# Patient Record
Sex: Male | Born: 1950 | Race: White | Hispanic: No | Marital: Single | Smoking: Current every day smoker
Health system: Southern US, Community
[De-identification: ages and names within clinical notes are randomized; demographics above are authoritative.]

## PROBLEM LIST (undated history)

## (undated) DIAGNOSIS — J449 Chronic obstructive pulmonary disease, unspecified: Secondary | ICD-10-CM

## (undated) DIAGNOSIS — Z8719 Personal history of other diseases of the digestive system: Secondary | ICD-10-CM

## (undated) DIAGNOSIS — C3490 Malignant neoplasm of unspecified part of unspecified bronchus or lung: Secondary | ICD-10-CM

## (undated) DIAGNOSIS — M1990 Unspecified osteoarthritis, unspecified site: Secondary | ICD-10-CM

## (undated) DIAGNOSIS — I10 Essential (primary) hypertension: Secondary | ICD-10-CM

## (undated) DIAGNOSIS — K219 Gastro-esophageal reflux disease without esophagitis: Secondary | ICD-10-CM

## (undated) DIAGNOSIS — I2510 Atherosclerotic heart disease of native coronary artery without angina pectoris: Secondary | ICD-10-CM

## (undated) DIAGNOSIS — E119 Type 2 diabetes mellitus without complications: Secondary | ICD-10-CM

## (undated) DIAGNOSIS — I700 Atherosclerosis of aorta: Secondary | ICD-10-CM

## (undated) HISTORY — PX: CARDIAC CATHETERIZATION: SHX172

## (undated) HISTORY — PX: LUNG BIOPSY: SHX232

## (undated) HISTORY — DX: Atherosclerosis of aorta: I70.0

## (undated) HISTORY — DX: Gastro-esophageal reflux disease without esophagitis: K21.9

## (undated) HISTORY — DX: Malignant neoplasm of unspecified part of unspecified bronchus or lung: C34.90

## (undated) HISTORY — DX: Atherosclerotic heart disease of native coronary artery without angina pectoris: I25.10

## (undated) HISTORY — PX: ELBOW SURGERY: SHX618

## (undated) MED FILL — Durvalumab Soln for IV Infusion 500 MG/10ML (50 MG/ML): INTRAVENOUS | Qty: 30 | Status: AC

## (undated) MED FILL — Carboplatin IV Soln 450 MG/45ML: INTRAVENOUS | Qty: 23 | Status: AC

---
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
## 2017-11-17 HISTORY — PX: PROSTATE SURGERY: SHX751

## 2019-11-18 HISTORY — PX: COLONOSCOPY: SHX174

## 2020-10-19 ENCOUNTER — Other Ambulatory Visit: Payer: Self-pay | Admitting: *Deleted

## 2020-10-19 ENCOUNTER — Ambulatory Visit (INDEPENDENT_AMBULATORY_CARE_PROVIDER_SITE_OTHER): Payer: Medicare Other | Admitting: Sports Medicine

## 2020-10-19 ENCOUNTER — Encounter: Payer: Self-pay | Admitting: Sports Medicine

## 2020-10-19 ENCOUNTER — Other Ambulatory Visit: Payer: Self-pay

## 2020-10-19 DIAGNOSIS — B351 Tinea unguium: Secondary | ICD-10-CM | POA: Diagnosis not present

## 2020-10-19 DIAGNOSIS — E1142 Type 2 diabetes mellitus with diabetic polyneuropathy: Secondary | ICD-10-CM | POA: Diagnosis not present

## 2020-10-19 DIAGNOSIS — M79675 Pain in left toe(s): Secondary | ICD-10-CM | POA: Diagnosis not present

## 2020-10-19 DIAGNOSIS — I890 Lymphedema, not elsewhere classified: Secondary | ICD-10-CM

## 2020-10-19 DIAGNOSIS — M79674 Pain in right toe(s): Secondary | ICD-10-CM

## 2020-10-19 NOTE — Progress Notes (Signed)
Subjective: Allen [name] is a 69 y.o. male patient with history of diabetes who presents to office today complaining of long,mildly painful nails  while ambulating in shoes; unable to trim. Patient states that his blood sugar was not recorded and does not routinely monitor states that he saw his PCP on last month and will go again on next week states that his legs are swollen with some numbness and tingling and some soreness at his long toenails.  Patient admits to a history of lower extremity swelling and COPD as well.    There are no problems to display for this patient.  No current outpatient medications on file prior to visit.   No current facility-administered medications on file prior to visit.   No Known Allergies  No results found for this or any previous visit (from the past 2160 hour(s)).  Objective: General: Patient is awake, alert, and oriented x 3 and in no acute distress.  Integument: Skin is warm, dry and supple bilateral. Nails are tender, long, thickened and dystrophic with subungual debris, consistent with onychomycosis, 1-5 bilateral. No signs of infection. No open lesions or preulcerative lesions present bilateral. Remaining integument unremarkable.  Vasculature:  Dorsalis Pedis pulse 0/4 bilateral. Posterior Tibial pulse  0/4 bilateral. Capillary fill time <5 sec 1-5 bilateral. No hair growth to the level of the digits. Temperature gradient within normal limits. No varicosities present bilateral. 2+ pitting and non pitting edema present bilateral.   Neurology: The patient has diminished sensation measured with a 5.07/10g Semmes Weinstein Monofilament at all pedal sites bilateral . Vibratory sensation absent bilateral with tuning fork. No Babinski sign present bilateral.   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 Muscular strength 5/5 in all lower extremity muscular groups bilateral without pain on range of motion . No tenderness with calf  compression bilateral.  Assessment and Plan: Problem List Items Addressed This Visit    None    Visit Diagnoses    Pain due to onychomycosis of toenails of both feet    -  Primary   Lymphedema       Diabetic polyneuropathy associated with type 2 diabetes mellitus ([place])          -Examined patient. -Discussed and educated patient on diabetic foot care, especially with regards to the vascular, neurological and musculoskeletal systems.  -Stressed the importance of good glycemic control and the detriment of not controlling glucose levels in relation to the foot. -Mechanically debrided all nails 1-5 bilateral using sterile nail nipper and filed with dremel without incident  -Advised patient that he needs to discuss with PCP fluid meds and meds for neuropathy if continues to be bothersome -Answered all patient questions -Patient to return  in 3 months for at risk foot care -Patient advised to call the office if any problems or questions arise in the meantime.  Landis Martins, DPM

## 2020-12-03 ENCOUNTER — Inpatient Hospital Stay (HOSPITAL_COMMUNITY)
Admission: EM | Admit: 2020-12-03 | Discharge: 2020-12-06 | DRG: 177 | Disposition: A | Discharge status: Home / Self Care | Payer: 59 | Attending: Family Medicine | Admitting: Family Medicine

## 2020-12-03 ENCOUNTER — Emergency Department (HOSPITAL_COMMUNITY): Payer: 59

## 2020-12-03 ENCOUNTER — Other Ambulatory Visit: Payer: Self-pay

## 2020-12-03 ENCOUNTER — Encounter (HOSPITAL_COMMUNITY): Payer: Self-pay | Admitting: *Deleted

## 2020-12-03 DIAGNOSIS — K219 Gastro-esophageal reflux disease without esophagitis: Secondary | ICD-10-CM | POA: Diagnosis present

## 2020-12-03 DIAGNOSIS — E1165 Type 2 diabetes mellitus with hyperglycemia: Secondary | ICD-10-CM | POA: Diagnosis present

## 2020-12-03 DIAGNOSIS — J439 Emphysema, unspecified: Secondary | ICD-10-CM | POA: Diagnosis present

## 2020-12-03 DIAGNOSIS — I10 Essential (primary) hypertension: Secondary | ICD-10-CM | POA: Diagnosis present

## 2020-12-03 DIAGNOSIS — N179 Acute kidney failure, unspecified: Secondary | ICD-10-CM | POA: Diagnosis present

## 2020-12-03 DIAGNOSIS — R0902 Hypoxemia: Secondary | ICD-10-CM | POA: Diagnosis present

## 2020-12-03 DIAGNOSIS — Z79899 Other long term (current) drug therapy: Secondary | ICD-10-CM | POA: Diagnosis not present

## 2020-12-03 DIAGNOSIS — U071 COVID-19: Principal | ICD-10-CM

## 2020-12-03 DIAGNOSIS — Z7984 Long term (current) use of oral hypoglycemic drugs: Secondary | ICD-10-CM

## 2020-12-03 DIAGNOSIS — F17210 Nicotine dependence, cigarettes, uncomplicated: Secondary | ICD-10-CM | POA: Diagnosis present

## 2020-12-03 DIAGNOSIS — J9601 Acute respiratory failure with hypoxia: Secondary | ICD-10-CM | POA: Diagnosis present

## 2020-12-03 HISTORY — DX: Chronic obstructive pulmonary disease, unspecified: J44.9

## 2020-12-03 HISTORY — DX: Essential (primary) hypertension: I10

## 2020-12-03 HISTORY — DX: Type 2 diabetes mellitus without complications: E11.9

## 2020-12-03 LAB — HIV ANTIBODY (ROUTINE TESTING W REFLEX): HIV Screen 4th Generation wRfx: NONREACTIVE

## 2020-12-03 LAB — CBC WITH DIFFERENTIAL/PLATELET
Abs Immature Granulocytes: 0.04 10*3/uL (ref 0.00–0.07)
Basophils Absolute: 0 10*3/uL (ref 0.0–0.1)
Basophils Relative: 0 %
Eosinophils Absolute: 0 10*3/uL (ref 0.0–0.5)
Eosinophils Relative: 0 %
HCT: 48.4 % (ref 39.0–52.0)
Hemoglobin: 15 g/dL (ref 13.0–17.0)
Immature Granulocytes: 0 %
Lymphocytes Relative: 5 %
Lymphs Abs: 0.4 10*3/uL — ABNORMAL LOW (ref 0.7–4.0)
MCH: 28.1 pg (ref 26.0–34.0)
MCHC: 31 g/dL (ref 30.0–36.0)
MCV: 90.6 fL (ref 80.0–100.0)
Monocytes Absolute: 0.7 10*3/uL (ref 0.1–1.0)
Monocytes Relative: 8 %
Neutro Abs: 7.8 10*3/uL — ABNORMAL HIGH (ref 1.7–7.7)
Neutrophils Relative %: 87 %
Platelets: 233 10*3/uL (ref 150–400)
RBC: 5.34 MIL/uL (ref 4.22–5.81)
RDW: 14.4 % (ref 11.5–15.5)
WBC: 9 10*3/uL (ref 4.0–10.5)
nRBC: 0 % (ref 0.0–0.2)

## 2020-12-03 LAB — COMPREHENSIVE METABOLIC PANEL
ALT: 29 U/L (ref 0–44)
AST: 30 U/L (ref 15–41)
Albumin: 3.6 g/dL (ref 3.5–5.0)
Alkaline Phosphatase: 55 U/L (ref 38–126)
Anion gap: 13 (ref 5–15)
BUN: 25 mg/dL — ABNORMAL HIGH (ref 8–23)
CO2: 25 mmol/L (ref 22–32)
Calcium: 8.9 mg/dL (ref 8.9–10.3)
Chloride: 95 mmol/L — ABNORMAL LOW (ref 98–111)
Creatinine, Ser: 1.55 mg/dL — ABNORMAL HIGH (ref 0.61–1.24)
GFR, Estimated: 48 mL/min — ABNORMAL LOW (ref >60)
Glucose, Bld: 131 mg/dL — ABNORMAL HIGH (ref 70–99)
Potassium: 4.7 mmol/L (ref 3.5–5.1)
Sodium: 133 mmol/L — ABNORMAL LOW (ref 135–145)
Total Bilirubin: 0.8 mg/dL (ref 0.3–1.2)
Total Protein: 7.3 g/dL (ref 6.5–8.1)

## 2020-12-03 LAB — URINALYSIS, ROUTINE W REFLEX MICROSCOPIC
Bacteria, UA: NONE SEEN
Bilirubin Urine: NEGATIVE
Glucose, UA: NEGATIVE mg/dL
Ketones, ur: 5 mg/dL — AB
Leukocytes,Ua: NEGATIVE
Nitrite: NEGATIVE
Protein, ur: 30 mg/dL — AB
Specific Gravity, Urine: 1.025 (ref 1.005–1.030)
pH: 5 (ref 5.0–8.0)

## 2020-12-03 LAB — PROCALCITONIN: Procalcitonin: <0.1 ng/mL

## 2020-12-03 LAB — RESP PANEL BY RT-PCR (FLU A&B, COVID) ARPGX2
Influenza A by PCR: NEGATIVE
Influenza B by PCR: NEGATIVE
SARS Coronavirus 2 by RT PCR: POSITIVE — AB

## 2020-12-03 LAB — FERRITIN: Ferritin: 143 ng/mL (ref 24–336)

## 2020-12-03 LAB — C-REACTIVE PROTEIN: CRP: 10.1 mg/dL — ABNORMAL HIGH (ref <1.0)

## 2020-12-03 LAB — LACTATE DEHYDROGENASE: LDH: 193 U/L — ABNORMAL HIGH (ref 98–192)

## 2020-12-03 LAB — FIBRINOGEN: Fibrinogen: 533 mg/dL — ABNORMAL HIGH (ref 210–475)

## 2020-12-03 LAB — TRIGLYCERIDES: Triglycerides: 110 mg/dL (ref <150)

## 2020-12-03 LAB — D-DIMER, QUANTITATIVE: D-Dimer, Quant: 1.02 ug/mL-FEU — ABNORMAL HIGH (ref 0.00–0.50)

## 2020-12-03 LAB — LACTIC ACID, PLASMA: Lactic Acid, Venous: 1.2 mmol/L (ref 0.5–1.9)

## 2020-12-03 MED ORDER — FLUTICASONE-UMECLIDIN-VILANT 100-62.5-25 MCG/INH IN AEPB: 1.0000 | INHALATION_SPRAY | Freq: Every day | RESPIRATORY_TRACT | Status: DC

## 2020-12-03 MED ORDER — SODIUM CHLORIDE 0.9 % IV SOLN
100.0000 mg | Freq: Every day | INTRAVENOUS | Status: DC
  Administered 2020-12-04 – 2020-12-06 (×3): 100 mg via INTRAVENOUS
  Filled 2020-12-03 (×4): qty 20

## 2020-12-03 MED ORDER — UMECLIDINIUM BROMIDE 62.5 MCG/INH IN AEPB
1.0000 | INHALATION_SPRAY | Freq: Every day | RESPIRATORY_TRACT | Status: DC
  Administered 2020-12-05 – 2020-12-06 (×2): 1 via RESPIRATORY_TRACT
  Filled 2020-12-03: qty 7

## 2020-12-03 MED ORDER — ALBUTEROL SULFATE HFA 108 (90 BASE) MCG/ACT IN AERS
1.0000 | INHALATION_SPRAY | Freq: Four times a day (QID) | RESPIRATORY_TRACT | Status: DC
  Administered 2020-12-03 – 2020-12-04 (×5): 2 via RESPIRATORY_TRACT
  Filled 2020-12-03 (×2): qty 6.7

## 2020-12-03 MED ORDER — ACETAMINOPHEN 325 MG PO TABS
650.0000 mg | ORAL_TABLET | Freq: Once | ORAL | Status: AC
  Administered 2020-12-03: 650 mg via ORAL
  Filled 2020-12-03: qty 2

## 2020-12-03 MED ORDER — SODIUM CHLORIDE 0.9 % IV SOLN
200.0000 mg | Freq: Once | INTRAVENOUS | Status: AC
  Administered 2020-12-03: 200 mg via INTRAVENOUS
  Filled 2020-12-03: qty 40

## 2020-12-03 MED ORDER — IOHEXOL 350 MG/ML SOLN
75.0000 mL | Freq: Once | INTRAVENOUS | Status: AC | PRN
  Administered 2020-12-03: 75 mL via INTRAVENOUS

## 2020-12-03 MED ORDER — FLUTICASONE FUROATE-VILANTEROL 200-25 MCG/INH IN AEPB
1.0000 | INHALATION_SPRAY | Freq: Every day | RESPIRATORY_TRACT | Status: DC
  Administered 2020-12-05 – 2020-12-06 (×2): 1 via RESPIRATORY_TRACT
  Filled 2020-12-03: qty 28

## 2020-12-03 MED ORDER — ENOXAPARIN SODIUM 40 MG/0.4ML ~~LOC~~ SOLN
40.0000 mg | SUBCUTANEOUS | Status: DC
  Administered 2020-12-03 – 2020-12-04 (×2): 40 mg via SUBCUTANEOUS
  Filled 2020-12-03 (×3): qty 0.4

## 2020-12-03 MED ORDER — ACETAMINOPHEN 650 MG RE SUPP: 650.0000 mg | Freq: Four times a day (QID) | RECTAL | Status: DC | PRN

## 2020-12-03 MED ORDER — DEXAMETHASONE 4 MG PO TABS
6.0000 mg | ORAL_TABLET | Freq: Every day | ORAL | Status: DC
  Administered 2020-12-04 – 2020-12-06 (×3): 6 mg via ORAL
  Filled 2020-12-03 (×3): qty 2

## 2020-12-03 MED ORDER — DEXAMETHASONE SODIUM PHOSPHATE 10 MG/ML IJ SOLN
6.0000 mg | Freq: Once | INTRAMUSCULAR | Status: AC
  Administered 2020-12-03: 6 mg via INTRAVENOUS
  Filled 2020-12-03: qty 1

## 2020-12-03 MED ORDER — INSULIN ASPART 100 UNIT/ML ~~LOC~~ SOLN
0.0000 [IU] | Freq: Three times a day (TID) | SUBCUTANEOUS | Status: DC
  Administered 2020-12-04: 1 [IU] via SUBCUTANEOUS
  Administered 2020-12-04: 2 [IU] via SUBCUTANEOUS
  Administered 2020-12-05: 1 [IU] via SUBCUTANEOUS
  Administered 2020-12-05 (×2): 3 [IU] via SUBCUTANEOUS
  Administered 2020-12-06: 1 [IU] via SUBCUTANEOUS

## 2020-12-03 MED ORDER — PANTOPRAZOLE SODIUM 40 MG PO TBEC: 40.0000 mg | DELAYED_RELEASE_TABLET | Freq: Every day | ORAL | Status: DC | PRN

## 2020-12-03 MED ORDER — SODIUM CHLORIDE 0.9 % IV BOLUS
500.0000 mL | Freq: Once | INTRAVENOUS | Status: AC
  Administered 2020-12-03: 500 mL via INTRAVENOUS

## 2020-12-03 MED ORDER — ALBUTEROL SULFATE HFA 108 (90 BASE) MCG/ACT IN AERS: 2.0000 | INHALATION_SPRAY | RESPIRATORY_TRACT | Status: DC | PRN

## 2020-12-03 MED ORDER — ACETAMINOPHEN 325 MG PO TABS
650.0000 mg | ORAL_TABLET | Freq: Four times a day (QID) | ORAL | Status: DC | PRN
  Administered 2020-12-05: 650 mg via ORAL
  Filled 2020-12-03: qty 2

## 2020-12-03 NOTE — Progress Notes (Signed)
Pt brought to CT @ 1250pm.  Pt refused CT and was taken back to ER

## 2020-12-03 NOTE — ED Triage Notes (Signed)
Pt arrived by [facility] from home. Test + for covid on 1/15 and having increase in sob and chest discomfort. Spo2 was 87% on room air per ems. Increased to 97% on 4L [employer].

## 2020-12-03 NOTE — ED Notes (Signed)
Hospital Bed Ordered @ 1752-per Lorrin Goodell, RN called by Levada Dy

## 2020-12-03 NOTE — ED Notes (Signed)
Notified Dr Almyra Free of pt's increased oxygen needs.

## 2020-12-03 NOTE — H&P (Cosign Needed Addendum)
[facility] Admission History and Physical Service Pager: [phone]  Patient name: Allen [name] Medical record number: [id number] Date of birth: Mar 24, 1951 Age: 70 y.o. Gender: male  Primary Care Provider: Darrol Jump, NP Consultants: None Code Status: Partial (DNI) Preferred Emergency Contact: Brother- Brunetta Jeans [phone]  Chief Complaint: Shortness of breath  Assessment and Plan: Allen [name] is a 70 y.o. male presenting with worsening shortness of breath and hypoxia in the setting of known COVID-19. PMH is significant for T2DM, HTN, GERD, tobacco use and COPD.  Acute Hypoxemic Respiratory Failure  COVID-19+ H/o emphysema Patient presents with worsening dyspnea x1 day after testing positive for COVID on 1/15 at [facility]. In the ED he was febrile to 101F, tachycardic to the 130s, and tachypneic with SpO2 87% on room air. On my exam patient breathing comfortably on 4.5L HFNC, but is persistently tachycardic in the 110s.  No oxygen requirement at baseline.  Labs remarkable for COVID+, Cr 1.55 (baseline unknown), CRP 10.1, D-Dimer 1.02, and other inflammatory markers minimally elevated. CXR was unremarkable. CTA showed patchy opacities in b/l lower lobes consistent with COVID as well as multiple nodules in the L lung, but no evidence of PE. Patient is unvaccinated.  He is a current 1 PPD smoker.  In the ED, patient received dexamethasone, remdesivir, 500 mL bolus NS.  Patient has home albuterol which he used frequently over the past 2 days and states that it did help his breathing improve intermittently. -Admit to FPTS, med-surg, attending Dr. Thompson Grayer -OOB with assistance only -s/p Dexamethasone 6mg  and Remdesevir 200mg  in the ED -Continue Remdesevir for 5 day total course -Continue Decadron 6mg  daily x10 days -Trend CBC, CRP, D-dimer -Supplemental O2 as needed, goal SpO2 88-92% -PT/OT eval and treat -Prone positioning as tolerated -Incentive  spirometer every hour while awake -Tylenol as needed -Continue home breathing treatments  -Trelegy, albuterol - covid vaccine in 21 days  ?AKI Cr 1.55 on admission. Patient's baseline is unknown. UA shows 5 ketones and 30 protein which may be indicative of some dehydration/AKI. -Daily BMP -Encourage PO intake -Holding home Lisinopril, HCTZ, and Lasix  T2DM Glucose 131 on admission. No prior records in chart or care everywhere, therefore A1c unknown. Home meds include Metformin 500mg  daily.  Likely to have hyperglycemia in setting of steroid use. -Will obtain A1c -sSSI -CBG monitoring AC &QHS -Holding home Metformin, consider restarting with improved kidney function  HTN Patient has been normotensive since arrival. Home meds include Lisinopril 20mg  daily, HCTZ 25mg  daily, and Lasix 20mg  daily (Lasix is for lower extremity edema) -Holding home meds due to AKI -Monitor BP  COPD  Tobacco Use On Trelegy and albuterol at home. Smokes 1 PPD for many years. -Continue home inhalers -Goal SpO2 88-92% -Patient declines nicotine patch   Lung Nodules on CTA CTA showed multiple nodular-like densities in the L lung. Could potentially be concerning for malignancy due to patient's long smoking history. No prior imaging to compare to. -Recommend follow-up CT in 3-6 months  GERD Chronic, stable. On omeprazole 40mg  daily prn at home. -Protonix 40mg  daily prn   FEN/GI: Heart healthy diet Prophylaxis: Lovenox  Disposition: med-surg  History of Present Illness:  Allen [name] is a 70 y.o. male presenting with shortness of breath x1 day. Patient tested positive for COVID on 1/15 at [facility]. He developed SOB yesterday evening and states he struggled through the night and then called EMS this morning. He reports associated chills. Endorses some cough and congestion but states  these are unchanged from baseline. No chest pain, abdominal pain, N/V/D or constipation. He is not vaccinated  against COVID-19. Patient also reports bilateral ankle swelling x1 month, which is currently being treated with Lasix.    Review Of Systems: Per HPI with the following additions:   Review of Systems  Constitutional: Positive for chills and fever.  HENT: Positive for congestion.   Respiratory: Positive for cough and shortness of breath.   Cardiovascular: Positive for leg swelling. Negative for chest pain.  Gastrointestinal: Negative for abdominal pain, constipation, diarrhea, nausea and vomiting.  Skin: Negative for rash.  Neurological: Negative for dizziness, syncope and headaches.     Patient Active Problem List   Diagnosis Date Noted  . COVID-19 12/03/2020   Past Medical History: Past Medical History:  Diagnosis Date  . COPD (chronic obstructive pulmonary disease) ([place])   . Diabetes mellitus without complication ([place])   . Hypertension    Past Surgical History: History reviewed. No pertinent surgical history.  Social History: Social History   Tobacco Use  . Smoking status: Current Every Day Smoker   Additional social history: Smokes 1ppd, lives at [place] with 2 cats. His neighbor keeps close eye on him.   Family History: No relevant past medical history  Allergies and Medications: No Known Allergies No current facility-administered medications on file prior to encounter.   Current Outpatient Medications on File Prior to Encounter  Medication Sig Dispense Refill  . albuterol (VENTOLIN HFA) 108 (90 Base) MCG/ACT inhaler Inhale 2 puffs into the lungs every 4 (four) hours as needed for wheezing or shortness of breath.    . Fluticasone-Umeclidin-Vilant (TRELEGY ELLIPTA) 100-62.5-25 MCG/INH AEPB Inhale 1 puff into the lungs daily.    . furosemide (LASIX) 20 MG tablet Take 20 mg by mouth daily as needed (leg swelling).    . hydrochlorothiazide (HYDRODIURIL) 25 MG tablet Take 25 mg by mouth daily.    Marland Kitchen lisinopril (ZESTRIL) 20 MG tablet Take 20 mg by mouth daily.    .  metFORMIN (GLUCOPHAGE) 500 MG tablet Take 500 mg by mouth daily.    Marland Kitchen omeprazole (PRILOSEC) 40 MG capsule Take 40 mg by mouth daily as needed (acid reflux/heartburn).    Marland Kitchen oxyCODONE-acetaminophen (PERCOCET) 10-325 MG tablet Take 1 tablet by mouth 3 (three) times daily as needed for pain.    Marland Kitchen ACCU-CHEK GUIDE test strip USE TO CHECK BLOOD SUGAR TWICE DAILY BEFORE MEALS    . dexamethasone (DECADRON) 6 MG tablet Take 6 mg by mouth.      Objective: BP 129/72   Pulse (!) 114   Temp (!) 97.5 F (36.4 C) (Oral)   Resp 20   SpO2 93%  Exam: General: alert, elderly gentleman, no acute distress Eyes: PERRL, normal sclera and conjunctiva ENTM: dry mucous membranes Neck: no cervical lymphadenopathy Cardiovascular: tachycardia, normal S1/S2 without m/r/g Respiratory: normal WOB on 4L [place]. Mild diffuse end-expiratory wheezes. No significant crackles Gastrointestinal: soft, nontender, nondistended Ext: 2+ pitting edema bilaterally up to knees. Chronic venous stasis changes Derm: diffuse epidermal inclusion cysts on back Neuro: oriented x3, grossly intact Psych: appropriate affect, normal speech  Labs and Imaging: CBC BMET  Recent Labs  Lab 12/03/20 1020  WBC 9.0  HGB 15.0  HCT 48.4  PLT 233   Recent Labs  Lab 12/03/20 1020  NA 133*  K 4.7  CL 95*  CO2 25  BUN 25*  CREATININE 1.55*  GLUCOSE 131*  CALCIUM 8.9    COVID positive CRP 10.1, LDH 193, ferritin  143, D-dimer 1.02, fibrinogen 533, procal <0.10, lactic acid 1.2  UA: small hgb, 5 ketones, 30 protein  EKG: Sinus tachycardia at 138bpm, wandering baseline  DG Chest Portable 1 View Result Date: 12/03/2020 IMPRESSION: No active disease. Electronically Signed   By: Marijo Conception M.D.   On: 12/03/2020 10:20   CT Angio Chest PE W/Cm &/Or Wo Cm Result Date: 12/03/2020 IMPRESSION: 1. No central or segmental pulmonary embolus. 2. Interval increase in patchy opacities within bilateral lower lobes likely representing developing  COVID-19 pneumonia. 3. Several nodular like densities measuring up to 1.4 cm within the left lung. Non-contrast chest CT at 3-6 months is recommended. If nodules persist, subsequent management will be based upon the most suspicious nodule(s). This recommendation follows the consensus statement: Guidelines for Management of Incidental Pulmonary Nodules Detected on CT Images: From the Fleischner Society 2017; Radiology 2017; 284:228-243. 4. Stable indeterminate right interlobar and mediastinal lymph nodes. 5. Aortic Atherosclerosis (ICD10-I70.0) and Emphysema (ICD10-J43.9). Electronically Signed   By: Iven Finn M.D.   On: 12/03/2020 16:42    Alcus Dad, MD 12/03/2020, 5:32 PM PGY-1, [place] Intern pager: [phone], text pages welcome  [place]    I have seen and examined this patient.     I have discussed the findings and exam with the intern and agree with the above note, which I have edited appropriately. I helped develop the management plan that is described in the resident's note, and I agree with the content.   Doristine Mango, DO PGY-3 Family Medicine Resident

## 2020-12-03 NOTE — ED Notes (Signed)
Pt made aware brother Darrow Barreiro) called for update, pt gave verbal consent to provide update. Brother updated at this time.

## 2020-12-03 NOTE — ED Notes (Signed)
Placed pt on 10L 02 per NRB

## 2020-12-03 NOTE — ED Notes (Signed)
Patient transported to CT 

## 2020-12-03 NOTE — ED Provider Notes (Addendum)
[place] EMERGENCY DEPARTMENT Provider Note   CSN: [id number] Arrival date & time: 12/03/20  0945     History Chief Complaint  Patient presents with  . Shortness of Breath    Allen [name] is a 70 y.o. male.  Who presents with recent diagnosis of COVID, symptoms and ongoing for 4 days now presenting with shortness of breath cough generalized body aches.  Denies vomiting or diarrhea.  He states that his breathing became worse last night and presents to the ER for evaluation.        Past Medical History:  Diagnosis Date  . COPD (chronic obstructive pulmonary disease) ([place])   . Diabetes mellitus without complication ([place])   . Hypertension     Patient Active Problem List   Diagnosis Date Noted  . COVID-19 12/03/2020    History reviewed. No pertinent surgical history.     History reviewed. No pertinent family history.  Social History   Tobacco Use  . Smoking status: Current Every Day Smoker    Home Medications Prior to Admission medications   Medication Sig Start Date End Date Taking? Authorizing Provider  albuterol (VENTOLIN HFA) 108 (90 Base) MCG/ACT inhaler Inhale 2 puffs into the lungs every 4 (four) hours as needed for wheezing or shortness of breath. 10/15/20  Yes [provider]  Fluticasone-Umeclidin-Vilant (TRELEGY ELLIPTA) 100-62.5-25 MCG/INH AEPB Inhale 1 puff into the lungs daily.   Yes [provider]  furosemide (LASIX) 20 MG tablet Take 20 mg by mouth daily as needed (leg swelling). 11/19/20  Yes [provider]  hydrochlorothiazide (HYDRODIURIL) 25 MG tablet Take 25 mg by mouth daily. 07/25/20  Yes [provider]  lisinopril (ZESTRIL) 20 MG tablet Take 20 mg by mouth daily. 08/16/20  Yes [provider]  metFORMIN (GLUCOPHAGE) 500 MG tablet Take 500 mg by mouth daily. 10/15/20  Yes [provider]  omeprazole (PRILOSEC) 40 MG capsule Take 40 mg by mouth daily as needed (acid  reflux/heartburn). 10/04/20  Yes [provider]  oxyCODONE-acetaminophen (PERCOCET) 10-325 MG tablet Take 1 tablet by mouth 3 (three) times daily as needed for pain. 11/26/20  Yes [provider]  ACCU-CHEK GUIDE test strip USE TO CHECK BLOOD SUGAR TWICE DAILY BEFORE MEALS 09/05/20   [provider]  dexamethasone (DECADRON) 6 MG tablet Take 6 mg by mouth. 12/02/20   [provider]    Allergies    Patient has no known allergies.  Review of Systems   Review of Systems  Constitutional: Positive for fever.  HENT: Negative for ear pain and sore throat.   Eyes: Negative for pain.  Respiratory: Positive for cough and shortness of breath.   Cardiovascular: Negative for leg swelling.  Gastrointestinal: Negative for abdominal pain.  Genitourinary: Negative for flank pain.  Musculoskeletal: Negative for back pain.  Skin: Negative for color change and rash.  Neurological: Negative for syncope.  All other systems reviewed and are negative.   Physical Exam Updated Vital Signs BP 131/71   Pulse (!) 107   Temp (!) 97.5 F (36.4 C) (Oral)   Resp 18   SpO2 93%   Physical Exam Constitutional:      General: He is not in acute distress.    Appearance: He is well-developed.  HENT:     Head: Normocephalic.     Nose: Nose normal.  Eyes:     Extraocular Movements: Extraocular movements intact.  Cardiovascular:     Rate and Rhythm: Normal rate.  Pulmonary:  Effort: Tachypnea and accessory muscle usage present.  Skin:    Coloration: Skin is not jaundiced.  Neurological:     Mental Status: He is alert. Mental status is at baseline.     ED Results / Procedures / Treatments   Labs (all labs ordered are listed, but only abnormal results are displayed) Labs Reviewed  RESP PANEL BY RT-PCR (FLU A&B, COVID) ARPGX2 - Abnormal; Notable for the following components:      Result Value   SARS Coronavirus 2 by RT PCR POSITIVE (*)    All other components  within normal limits  COMPREHENSIVE METABOLIC PANEL - Abnormal; Notable for the following components:   Sodium 133 (*)    Chloride 95 (*)    Glucose, Bld 131 (*)    BUN 25 (*)    Creatinine, Ser 1.55 (*)    GFR, Estimated 48 (*)    All other components within normal limits  CBC WITH DIFFERENTIAL/PLATELET - Abnormal; Notable for the following components:   Neutro Abs 7.8 (*)    Lymphs Abs 0.4 (*)    All other components within normal limits  URINALYSIS, ROUTINE W REFLEX MICROSCOPIC - Abnormal; Notable for the following components:   Hgb urine dipstick SMALL (*)    Ketones, ur 5 (*)    Protein, ur 30 (*)    All other components within normal limits  D-DIMER, QUANTITATIVE (NOT AT [facility]) - Abnormal; Notable for the following components:   D-Dimer, Quant 1.02 (*)    All other components within normal limits  LACTATE DEHYDROGENASE - Abnormal; Notable for the following components:   LDH 193 (*)    All other components within normal limits  FIBRINOGEN - Abnormal; Notable for the following components:   Fibrinogen 533 (*)    All other components within normal limits  C-REACTIVE PROTEIN - Abnormal; Notable for the following components:   CRP 10.1 (*)    All other components within normal limits  CULTURE, BLOOD (ROUTINE X 2)  CULTURE, BLOOD (ROUTINE X 2)  LACTIC ACID, PLASMA  PROCALCITONIN  FERRITIN  TRIGLYCERIDES    EKG None  Radiology DG Chest Portable 1 View  Result Date: 12/03/2020 CLINICAL DATA:  Shortness of breath, COVID-19 positive. EXAM: PORTABLE CHEST 1 VIEW COMPARISON:  December 01, 2020. FINDINGS: The heart size and mediastinal contours are within normal limits. Both lungs are clear. No pneumothorax or pleural effusion is noted. The visualized skeletal structures are unremarkable. IMPRESSION: No active disease. Electronically Signed   By: Marijo Conception M.D.   On: 12/03/2020 10:20    Procedures .Critical Care Performed by: Luna Fuse, MD Authorized by: Luna Fuse, MD   Critical care provider statement:    Critical care time was exclusive of:  Separately billable procedures and treating other patients   Critical care was necessary to treat or prevent imminent or life-threatening deterioration of the following conditions:  Respiratory failure   (including critical care time)  Medications Ordered in ED Medications  remdesivir 200 mg in sodium chloride 0.9% 250 mL IVPB (has no administration in time range)    Followed by  remdesivir 100 mg in sodium chloride 0.9 % 100 mL IVPB (has no administration in time range)  dexamethasone (DECADRON) injection 6 mg (6 mg Intravenous Given 12/03/20 1220)  acetaminophen (TYLENOL) tablet 650 mg (650 mg Oral Given 12/03/20 1224)  sodium chloride 0.9 % bolus 500 mL (0 mLs Intravenous Stopped 12/03/20 1504)    ED Course  I have reviewed  the triage vital signs and the nursing notes.  Pertinent labs & imaging results that were available during my care of the patient were reviewed by me and considered in my medical decision making (see chart for details).    MDM Rules/Calculators/A&P                          Labs show white count 9 hemoglobin 15 chemistry unremarkable.  COVID test is known +2 days ago palpation.  He remains tachycardic heart rate about 130 to 140 bpm.  Chest x-ray is relatively unremarkable per radiologist.  Given his COVID-positive status and hypoxemia, given Decadron 6 mg IV.  CT angio of the chest pursued given relatively benign appearance of chest x-ray compared to patient's clinical presentation.  CT angio of the chest was ordered.  However the patient refused the study stating that the did not want to lie in a machine.  Ultimately able to change his mind but CT angio delayed.  Will be admitted to the hospitalist team for COVID infection and hypoxemia.   Final Clinical Impression(s) / ED Diagnoses Final diagnoses:  [id number] virus infection  Hypoxemia    Rx / DC Orders ED  Discharge Orders    None       Luna Fuse, MD 12/03/20 1233    Luna Fuse, MD 12/03/20 [id number]

## 2020-12-04 DIAGNOSIS — U071 COVID-19: Principal | ICD-10-CM

## 2020-12-04 LAB — CBC
HCT: 43.8 % (ref 39.0–52.0)
Hemoglobin: 14.3 g/dL (ref 13.0–17.0)
MCH: 29.5 pg (ref 26.0–34.0)
MCHC: 32.6 g/dL (ref 30.0–36.0)
MCV: 90.5 fL (ref 80.0–100.0)
Platelets: 206 10*3/uL (ref 150–400)
RBC: 4.84 MIL/uL (ref 4.22–5.81)
RDW: 14.5 % (ref 11.5–15.5)
WBC: 7.6 10*3/uL (ref 4.0–10.5)
nRBC: 0 % (ref 0.0–0.2)

## 2020-12-04 LAB — HEMOGLOBIN A1C
Hgb A1c MFr Bld: 7 % — ABNORMAL HIGH (ref 4.8–5.6)
Mean Plasma Glucose: 154.2 mg/dL

## 2020-12-04 LAB — CBG MONITORING, ED
Glucose-Capillary: 104 mg/dL — ABNORMAL HIGH (ref 70–99)
Glucose-Capillary: 101 mg/dL — ABNORMAL HIGH (ref 70–99)
Glucose-Capillary: 128 mg/dL — ABNORMAL HIGH (ref 70–99)

## 2020-12-04 LAB — BASIC METABOLIC PANEL
Anion gap: 11 (ref 5–15)
BUN: 23 mg/dL (ref 8–23)
CO2: 25 mmol/L (ref 22–32)
Calcium: 8.6 mg/dL — ABNORMAL LOW (ref 8.9–10.3)
Chloride: 96 mmol/L — ABNORMAL LOW (ref 98–111)
Creatinine, Ser: 1.16 mg/dL (ref 0.61–1.24)
GFR, Estimated: >60 mL/min (ref >60)
Glucose, Bld: 99 mg/dL (ref 70–99)
Potassium: 4.8 mmol/L (ref 3.5–5.1)
Sodium: 132 mmol/L — ABNORMAL LOW (ref 135–145)

## 2020-12-04 LAB — GLUCOSE, CAPILLARY
Glucose-Capillary: 164 mg/dL — ABNORMAL HIGH (ref 70–99)
Glucose-Capillary: 189 mg/dL — ABNORMAL HIGH (ref 70–99)

## 2020-12-04 LAB — D-DIMER, QUANTITATIVE: D-Dimer, Quant: 0.97 ug/mL-FEU — ABNORMAL HIGH (ref 0.00–0.50)

## 2020-12-04 LAB — C-REACTIVE PROTEIN: CRP: 18.4 mg/dL — ABNORMAL HIGH (ref <1.0)

## 2020-12-04 MED ORDER — AEROCHAMBER PLUS FLO-VU LARGE MISC
Status: AC
  Administered 2020-12-04: 1
  Filled 2020-12-04: qty 1

## 2020-12-04 MED ORDER — METFORMIN HCL 500 MG PO TABS
500.0000 mg | ORAL_TABLET | Freq: Every day | ORAL | Status: DC
  Administered 2020-12-04 – 2020-12-05 (×2): 500 mg via ORAL
  Filled 2020-12-04 (×2): qty 1

## 2020-12-04 MED ORDER — ALBUTEROL SULFATE HFA 108 (90 BASE) MCG/ACT IN AERS
1.0000 | INHALATION_SPRAY | Freq: Two times a day (BID) | RESPIRATORY_TRACT | Status: DC
  Administered 2020-12-05 (×2): 2 via RESPIRATORY_TRACT
  Administered 2020-12-06: 1 via RESPIRATORY_TRACT

## 2020-12-04 MED ORDER — ALBUTEROL SULFATE HFA 108 (90 BASE) MCG/ACT IN AERS: 2.0000 | INHALATION_SPRAY | Freq: Four times a day (QID) | RESPIRATORY_TRACT | Status: DC | PRN

## 2020-12-04 MED ORDER — HYDROCHLOROTHIAZIDE 25 MG PO TABS
25.0000 mg | ORAL_TABLET | Freq: Every day | ORAL | Status: DC
  Administered 2020-12-04 – 2020-12-06 (×3): 25 mg via ORAL
  Filled 2020-12-04 (×3): qty 1

## 2020-12-04 MED ORDER — LISINOPRIL 20 MG PO TABS
20.0000 mg | ORAL_TABLET | Freq: Every day | ORAL | Status: DC
  Administered 2020-12-04 – 2020-12-06 (×3): 20 mg via ORAL
  Filled 2020-12-04 (×3): qty 1

## 2020-12-04 MED ORDER — SODIUM CHLORIDE 0.9 % IV BOLUS
500.0000 mL | Freq: Once | INTRAVENOUS | Status: AC
  Administered 2020-12-04: 500 mL via INTRAVENOUS

## 2020-12-04 NOTE — ED Notes (Signed)
Patient became very SOB while standing to use the bathroom,

## 2020-12-04 NOTE — Evaluation (Addendum)
Occupational Therapy Evaluation Patient Details Name: Allen [name] MRN: [id number] DOB: [birth year] Today's Date: 12/04/2020    History of Present Illness 70 yo male presenting with worsening SOB and hypoxia. Tested COVID+ on 1/17.  PMH is significant for T2DM, HTN, GERD, tobacco use and COPD.   Clinical Impression   PTA, pt was living with his friend and reports he was independent with ADLs, IADLs, and driving. Pt currently requiring Min A for ADLs and functional mobility. Pt presenting with decreased balance, strength, and activity tolerance. SpO2 maintaining 90s on 6L via HFNC. Pt would benefit from further acute OT to facilitate safe dc. Recommend dc to home with HHOT for further OT to optimize safety, independence with ADLs, and return to PLOF.     Follow Up Recommendations  Home health OT    Equipment Recommendations  Other (comment) (RW)    Recommendations for Other Services PT consult     Precautions / Restrictions Precautions Precautions: Fall      Mobility Bed Mobility               General bed mobility comments: Sitting at EOB upon arrival    Transfers Overall transfer level: Needs assistance   Transfers: Sit to/from Stand Sit to Stand: Min assist         General transfer comment: Min A for gaining balance in standing    Balance                                           ADL either performed or assessed with clinical judgement   ADL Overall ADL's : Needs assistance/impaired Eating/Feeding: Set up;Sitting   Grooming: Set up;Supervision/safety;Sitting   Upper Body Bathing: Minimal assistance;Sitting   Lower Body Bathing: Minimal assistance;Sit to/from stand   Upper Body Dressing : Sitting;Min guard   Lower Body Dressing: Minimal assistance Lower Body Dressing Details (indicate cue type and reason): Min A to initiate donning socks then pt able to finish task. Min A for standing balance. Toilet Transfer: Minimal assistance;+2  for safety/equipment;Ambulation (single hand held; simulated in room)           Functional mobility during ADLs: Minimal assistance;+2 for safety/equipment General ADL Comments: Pt presenting with decreased strength, balance, and activity tolerance.     Vision Baseline Vision/History: Wears glasses Wears Glasses: At all times Patient Visual Report: No change from baseline       Perception     Praxis      Pertinent Vitals/Pain Pain Assessment: No/denies pain     Hand Dominance Right   Extremity/Trunk Assessment Upper Extremity Assessment Upper Extremity Assessment: Overall WFL for tasks assessed   Lower Extremity Assessment Lower Extremity Assessment: Defer to PT evaluation   Cervical / Trunk Assessment Cervical / Trunk Assessment: Other exceptions Cervical / Trunk Exceptions: Increased body habitus   Communication Communication Communication: HOH   Cognition Arousal/Alertness: Awake/alert Behavior During Therapy: WFL for tasks assessed/performed Overall Cognitive Status: No family/caregiver present to determine baseline cognitive functioning                                 General Comments: Feel pt is at baseline. Does present with decreased safety awareness and problem solving.   General Comments  SpO2 90s on 6L via HFNC.    Exercises     Shoulder Instructions  Home Living Family/patient expects to be discharged to:: Private residence Living Arrangements: Non-relatives/Friends Available Help at Discharge: Friend(s);Available PRN/intermittently Type of Home: House Home Access: Ramped entrance     Home Layout: One level     Bathroom Shower/Tub: Teacher, early years/pre: Standard     Home Equipment: Shower seat;Tub bench          Prior Functioning/Environment Level of Independence: Independent        Comments: Reports he performs ADLs, IADLs, and drives. Enjoys "drinking beer and chasing women."        OT  Problem List: Decreased strength;Decreased range of motion;Decreased activity tolerance;Impaired balance (sitting and/or standing);Decreased safety awareness;Decreased knowledge of use of DME or AE;Decreased knowledge of precautions      OT Treatment/Interventions: Self-care/ADL training;Therapeutic exercise;Energy conservation;DME and/or AE instruction;Therapeutic activities;Patient/family education    OT Goals(Current goals can be found in the care plan section) Acute Rehab OT Goals Patient Stated Goal: Get moved out of this room OT Goal Formulation: With patient Time For Goal Achievement: 12/18/20 Potential to Achieve Goals: Good  OT Frequency: Min 2X/week   Barriers to D/C:            Co-evaluation              AM-PAC OT "6 Clicks" Daily Activity     Outcome Measure Help from another person eating meals?: A Little Help from another person taking care of personal grooming?: A Little Help from another person toileting, which includes using toliet, bedpan, or urinal?: A Little Help from another person bathing (including washing, rinsing, drying)?: A Little Help from another person to put on and taking off regular upper body clothing?: A Little Help from another person to put on and taking off regular lower body clothing?: A Little 6 Click Score: 18   End of Session Equipment Utilized During Treatment: Oxygen Nurse Communication: Mobility status  Activity Tolerance: Patient tolerated treatment well Patient left: in bed;with call bell/phone within reach;Other (comment) (sitting at EOB)  OT Visit Diagnosis: Unsteadiness on feet (R26.81);Other abnormalities of gait and mobility (R26.89);Muscle weakness (generalized) (M62.81)                Time: 6060-0459 OT Time Calculation (min): 11 min Charges:  OT General Charges $OT Visit: 1 Visit OT Evaluation $OT Eval Moderate Complexity: [place], OTR/L Acute Rehab Pager: [phone] Office:  [place] 12/04/2020, 3:35 PM

## 2020-12-04 NOTE — Hospital Course (Addendum)
Allen [name] presented to the [facility] on 1/17 with worsening shortness of breath and hypoxia one day after being diagnosed with COVID-19 at [facility]. He received remdesivir and dexamethasone per protocol and required supplemental O2 up to 6L HFNC. He received remdesivir, dexamethasone, and supportive care. O2 was weaned until he was breathing comfortably on 2L [place] and was able to stand and walk independently at which point he requested to return home. He was discharged with home oxygen.   Items for PCP Follow-up: 1) CT scan with multiple nodular densities measuring up to 1.4cm in L lung. Concerning for malignancy given pt's smoking history. Recommend follow-up non-con CT in 3-6 mos.  2) Given his multiple comorbidities, he is at high risk for serious disease with COVID-19. Recommend COVID vaccination in 21 days.

## 2020-12-04 NOTE — Progress Notes (Addendum)
Family Medicine Teaching Service Daily Progress Note Intern Pager: [phone]  Patient name: Allen [name] Medical record number: [id number] Date of birth: 1951-01-07 Age: 70 y.o. Gender: male  Primary Care Provider: Darrol Jump, NP Consultants: None Code Status: Partial; DNI  Pt Overview and Major Events to Date:  1/17 admitted  Assessment and Plan: Allen [name] is a 70 y.o. male presenting with worsening shortness of breath and hypoxia in the setting of known COVID-19. PMH is significant for T2DM, HTN, GERD, tobacco use and COPD.  Acute Hypoxemic Respiratory Failure  COVID-19+ H/o emphysema Patient reports feeling better this morning and requesting to go home, but has increasing O2 requirement. Initially febrile on admission but afebrile overnight s/p Tylenol x1. Now requiring  6L HFNC and satting 92%. Patient desats to 70s off oxygen and to low 80s when standing, even with O2 in place. D-dimer stable around 1. CRP 10.1>18.4. No white count.  - Day 2/5 remdesivir  - Day 2/10 Decadron  - If rapidly increasing O2 reqirement, can consider adding Baricitnib.  - Supplemental O2 as needed, goal SpO2 88-92% - PT/OT eval and treat - Prone positioning as tolerated - Incentive spirometer every hour while awake - Tylenol PRN - Continue home breathing treatments             -Trelegy, albuterol - Covid vaccine in 21 days  AKI- Resolved Cr 1.55 on admission. Patient's baseline is unknown. 1.16 this morning.  -Daily BMP -Encourage PO intake  HTN Had been holding home meds because patient was normotensive and concern for AKI. Most recent BPs have been 140s-50s/70s. Home meds include Lisinopril 20mg  daily, HCTZ 25mg  daily, and Lasix 20mg  daily (Lasix is for lower extremity edema) - Okay to restart Lisinopril 20mg  daily and HCTZ 25mg  daily - Continue to hold Lasix  - Monitor BP  T2DM Glucose 131>99. A1c 7.0%. Home meds include Metformin 500mg  daily.  Had been holding Metformin for concern  with kidney function.  -sSSI -CBG monitoring AC &QHS - Okay to restart Metformin   COPD  Tobacco Use On Trelegy and albuterol at home. Smokes 1 PPD for many years. -Continue home inhalers -Goal SpO2 88-92% -Patient declines nicotine patch   Lung Nodules on CTA CTA showed multiple nodular-like densities in the L lung. Could potentially be concerning for malignancy due to patient's long smoking history. No prior imaging to compare to. -Recommend follow-up CT in 3-6 months    FEN/GI: Heart healthy diet Prophylaxis: Lovenox   Status is: Inpatient  Remains inpatient appropriate because:Inpatient level of care appropriate due to severity of illness   Dispo: The patient is from: Home              Anticipated d/c is to: Home              Anticipated d/c date is: 2 days              Patient currently is not medically stable to d/c.   Subjective:  Allen [name] reports being very short of breath today. He initially stated that he intended to leave the hospital today, even if it meant signing out [name]. States that he "is being held against his will" and that he "can manage at home."  However, after a discussion with RN case manager and a friend, he has decided to stay. He denies any CP.  Objective: Temp:  [97.5 F (36.4 C)-101 F (38.3 C)] 97.9 F (36.6 C) (01/18 0254) Pulse Rate:  [80-142] 111 (01/18 0600) Resp:  [  18-33] 20 (01/18 0600) BP: (115-138)/(59-84) 136/72 (01/18 0600) SpO2:  [82 %-97 %] 91 % (01/18 0600) Weight:  [108.4 kg] 108.4 kg (01/18 0353) Physical Exam: General: Appears uncomfortable lying in bed Cardiovascular: Tachycardia, no m/r/g Respiratory: Increased WOB, lungs with diffuse wheezes and bibasilar crackles Abdomen: Soft, non-tender, non-distended Extremities: BLE with trace pitting edema  Laboratory: Recent Labs  Lab 12/03/20 1020 12/04/20 0512  WBC 9.0 7.6  HGB 15.0 14.3  HCT 48.4 43.8  PLT 233 206   Recent Labs  Lab 12/03/20 1020  12/04/20 0512  NA 133* 132*  K 4.7 4.8  CL 95* 96*  CO2 25 25  BUN 25* 23  CREATININE 1.55* 1.16  CALCIUM 8.9 8.6*  PROT 7.3  --   BILITOT 0.8  --   ALKPHOS 55  --   ALT 29  --   AST 30  --   GLUCOSE 131* 99    Imaging/Diagnostic Tests: CTA neg for PE, with bilateral opacities c/w COVID  Multiple nodular densities noted in L lung  [name], Medical Student 12/04/2020, 6:56 AM  Resident attestation: I agree with the documentation of Student Dr. [name] above. I have made adjustments to his note as appropriate. I have seen the patient and performed physical exam on the patient consistent with his documented physical exam above.  In addition, patient continues to be tachycardic. I went to reevaluate him and found him tachycardic in the low 100-110s with regular sounding rhythm, no complaints of chest pain or other distress.  He continues on 6 L high flow nasal cannula.  Patient is a bit of a mixed picture with some trace pitting edema in his lower extremities but dry mucous membranes and complains that he feels he may be dehydrated.  I have ordered a twelve-lead EKG.  If this is normal we will give the patient a 1/2L bolus of IV fluids.  He denies any history of CHF.  We will continue to monitor his heart rate and his breathing status.  Lurline Del, DO

## 2020-12-04 NOTE — Plan of Care (Signed)
  Problem: Education: Goal: Knowledge of risk factors and measures for prevention of condition will improve Outcome: Progressing   Problem: Coping: Goal: Psychosocial and spiritual needs will be supported Outcome: Progressing   Problem: Respiratory: Goal: Will maintain a patent airway Outcome: Progressing Goal: Complications related to the disease process, condition or treatment will be avoided or minimized Outcome: Progressing   

## 2020-12-04 NOTE — Discharge Planning (Signed)
RNCM consulted regarding pt attempting to leave AMA and requiring a ride home.  RNCM encouraged pt to stay for treatment along with a phone call from a friend who reminded pt that it is not wise to go home and spread it to his wife.  Pt states he will complete treatment.

## 2020-12-04 NOTE — ED Notes (Signed)
MS Breakfast Ordered 

## 2020-12-04 NOTE — Progress Notes (Signed)
12/04/20 1620  PT Eval Information  Last PT Received On 12/04/20  Assistance Needed +1  PT/OT/SLP Co-Evaluation/Treatment Yes  Reason for Co-Treatment For patient/therapist safety;To address functional/ADL transfers  PT goals addressed during session Mobility/safety with mobility;Balance  History of Present Illness 70 yo male presenting with worsening SOB and hypoxia. Tested COVID+ on 1/17.  PMH is significant for T2DM, HTN, GERD, tobacco use and COPD.  Precautions  Precautions Fall  Restrictions  Weight Bearing Restrictions No  Home Living  Family/patient expects to be discharged to: Private residence  Living Arrangements Non-relatives/Friends  Available Help at Discharge Friend(s);Available PRN/intermittently  Type of [place] entrance  Home Layout One level  Bathroom Shower/Tub Tub/shower unit  English as a second language teacher;Tub bench  Prior Function  Level of Independence Independent  Comments Reports he performs ADLs, IADLs, and drives. Enjoys "drinking beer and chasing women."  Communication  Communication HOH  Pain Assessment  Pain Assessment No/denies pain  Cognition  Arousal/Alertness Awake/alert  Behavior During Therapy WFL for tasks assessed/performed  Overall Cognitive Status No family/caregiver present to determine baseline cognitive functioning  General Comments Feel pt is at baseline. Does present with decreased safety awareness and problem solving.  Upper Extremity Assessment  Upper Extremity Assessment Defer to OT evaluation  Lower Extremity Assessment  Lower Extremity Assessment Generalized weakness  Cervical / Trunk Assessment  Cervical / Trunk Assessment Other exceptions  Cervical / Trunk Exceptions Increased body habitus  Bed Mobility  General bed mobility comments Sitting at EOB upon arrival  Transfers  Overall transfer level Needs assistance  Equipment used None  Transfers Sit to/from Stand  Sit to  Stand Min assist;+2 safety/equipment  General transfer comment Min A for gaining balance in standing  Ambulation/Gait  Ambulation/Gait assistance Min guard;Min assist  Gait Distance (Feet) 5 Feet  Assistive device None  Gait Pattern/deviations Step-through pattern;Decreased stride length  General Gait Details Pt able to ambulate short distance within ED room. Pt fatiguing easily and with increased SOB. Oxygen sats at 90% and above on 6L HFNC.  Balance  Overall balance assessment Needs assistance  Sitting-balance support No upper extremity supported  Sitting balance-Leahy Scale Fair  Standing balance support No upper extremity supported  Standing balance-Leahy Scale Fair  Standing balance comment Able to maintain static standing without support  General Comments  General comments (skin integrity, edema, etc.) SpO2 90s on 6L via HFNC.  PT - End of Session  Equipment Utilized During Treatment Oxygen  Activity Tolerance Patient limited by fatigue (SOB)  Patient left in bed;with call bell/phone within reach (sitting EOB in ED)  Nurse Communication Mobility status  PT Assessment  PT Recommendation/Assessment Patient needs continued PT services  PT Visit Diagnosis Unsteadiness on feet (R26.81);Muscle weakness (generalized) (M62.81)  PT Problem List Decreased strength;Decreased balance;Decreased activity tolerance;Decreased mobility;Decreased coordination;Decreased knowledge of use of DME;Decreased knowledge of precautions;Cardiopulmonary status limiting activity;Decreased safety awareness  PT Plan  PT Frequency (ACUTE ONLY) Min 3X/week  PT Treatment/Interventions (ACUTE ONLY) DME instruction;Gait training;Functional mobility training;Therapeutic exercise;Therapeutic activities;Balance training;Patient/family education  AM-PAC PT "6 Clicks" Mobility Outcome Measure (Version 2)  Help needed turning from your back to your side while in a flat bed without using bedrails? 3  Help needed moving  from lying on your back to sitting on the side of a flat bed without using bedrails? 3  Help needed moving to and from a bed to a chair (including a wheelchair)? 3  Help needed standing up from  a chair using your arms (e.g., wheelchair or bedside chair)? 3  Help needed to walk in hospital room? 3  Help needed climbing 3-5 steps with a railing?  3  6 Click Score 18  Consider Recommendation of Discharge To: Home with [facility]  PT Recommendation  Follow Up Recommendations Home health PT  PT equipment Other (comment) (TBD)  Individuals Consulted  Consulted and Agree with Results and Recommendations Patient  Acute Rehab PT Goals  Patient Stated Goal Get moved out of this room  PT Goal Formulation With patient  Time For Goal Achievement 12/18/20  Potential to Achieve Goals Good  PT Time Calculation  PT Start Time (ACUTE ONLY) 1451  PT Stop Time (ACUTE ONLY) 1507  PT Time Calculation (min) (ACUTE ONLY) 16 min  PT General Charges  $$ ACUTE PT VISIT 1 Visit  PT Evaluation  $PT Eval Moderate Complexity 1 Mod  Written Expression  Dominant Hand Right   Pt admitted secondary to problem above with deficits above. Pt with increased fatigue and SOB but O2 sast at 90% and above on 6L HFNC. Min A for steadying throughout. Recommending HHPT at d/c to increase independence and safety. Will continue to follow acutely.   Reuel Derby, PT, DPT  Acute Rehabilitation Services  Pager: [phone] Office: [phone]

## 2020-12-04 NOTE — ED Notes (Signed)
Report given to Amber RN

## 2020-12-05 LAB — BASIC METABOLIC PANEL
Anion gap: 12 (ref 5–15)
BUN: 25 mg/dL — ABNORMAL HIGH (ref 8–23)
CO2: 24 mmol/L (ref 22–32)
Calcium: 8.5 mg/dL — ABNORMAL LOW (ref 8.9–10.3)
Chloride: 97 mmol/L — ABNORMAL LOW (ref 98–111)
Creatinine, Ser: 1.09 mg/dL (ref 0.61–1.24)
GFR, Estimated: >60 mL/min (ref >60)
Glucose, Bld: 114 mg/dL — ABNORMAL HIGH (ref 70–99)
Potassium: 4.3 mmol/L (ref 3.5–5.1)
Sodium: 133 mmol/L — ABNORMAL LOW (ref 135–145)

## 2020-12-05 LAB — GLUCOSE, CAPILLARY
Glucose-Capillary: 207 mg/dL — ABNORMAL HIGH (ref 70–99)
Glucose-Capillary: 126 mg/dL — ABNORMAL HIGH (ref 70–99)
Glucose-Capillary: 208 mg/dL — ABNORMAL HIGH (ref 70–99)

## 2020-12-05 MED ORDER — SODIUM CHLORIDE 0.9 % IV BOLUS
500.0000 mL | Freq: Once | INTRAVENOUS | Status: AC
  Administered 2020-12-05: 500 mL via INTRAVENOUS

## 2020-12-05 NOTE — TOC Progression Note (Signed)
Transition of Care [facility]) - Progression Note    Patient Details  Name: Cameryn Chrisley MRN: [id number] Date of Birth: August 10, 1951  Transition of Care [facility]) CM/SW [place], RN Phone Number: [phone]  12/05/2020, 4:00 PM  Clinical Narrative:    O2 for home use has been set up with [employer]. Portable tank to be delivered to the room.        Expected Discharge Plan and Services                           DME Arranged: Oxygen DME Agency: [employer] Date DME Agency Contacted: 12/05/20 Time DME Agency Contacted: 1600 Representative spoke with at DME Agency: [place] Interventions    Readmission Risk Interventions No flowsheet data found.

## 2020-12-05 NOTE — Progress Notes (Addendum)
Family Medicine Teaching Service Daily Progress Note Intern Pager: [phone]  Patient name: Allen [name] Medical record number: [id number] Date of birth: 02-11-1951 Age: 70 y.o. Gender: male  Primary Care Provider: Darrol Jump, NP Consultants: None Code Status: Partial; DNI  Pt Overview and Major Events to Date:  1/17 admitted  Assessment and Plan: Allen [name] a 69 y.o.malepresenting withworsening shortness of breathand hypoxiain the setting of known COVID-19. PMH is significant forT2DM,HTN, GERD,tobacco useandCOPD.  Acute Hypoxemic Respiratory Failure  COVID-19+H/o emphysema Currently on 5L [place]. Tachycardia noted yesterday has since resolved. Patient desats to mid 76s when off oxygen and when standing desats with at 4L. He was able to ambulate from the chair to the bed repeatedly on 5L and begain desatting after 3-4 trips between the bed and chair. Upon sitting down his O2 quickly increased to 88-92%. He appears somewhat fluid down on exam today. Patient requesting to discharge home.  - Day 3/5 remdesivir  - Day 3/10 Decadron  - Will give additional 500cc bolus - If rapidly increasing O2 reqirement, can consider adding Baricitnib.  - Supplemental O2 as needed, goal SpO2 88-92% - PT/OT eval and treat - Prone positioning as tolerated - Incentive spirometer every hour while awake - Tylenol PRN - Continue home breathing treatments -Trelegy, albuterol - Covid vaccine in 21 days  HTN Most recent BPs have been 110-120s/50s. Home medsincludeLisinopril 20mg  daily, HCTZ 25mg  daily,andLasix 20mg  daily(Lasix is for lower extremity edema) - Continue Lisinopril 20mg  daily and HCTZ 25mg  daily - Continue to hold Lasix  - Monitor BP  T2DM Glucose 100s-180s. A1c 7.0%.Home medsincludeMetformin 500mg  daily.Only required 3u SSI yesterday. -sSSI -CBG monitoringAC &QHS - Continue Metformin  AKI- Resolved Cr 1.55>>1.09 -Daily BMP -Encourage PO  intake  COPD Tobacco Use On Trelegy and albuterol at home. Smokes 1 PPD for many years. -Continue home inhalers -Goal SpO2 88-92% -Patient declines nicotine patch   Lung Nodules on CTA CTA showed multiple nodular-like densities in the L lung. Could potentially be concerning for malignancy due to patient's long smoking history. No prior imaging to compare to. -Recommend follow-up CT in 3-6 months   FEN/GI:Heart healthy diet Prophylaxis:Lovenox Status is: Inpatient  Remains inpatient appropriate because:Inpatient level of care appropriate due to severity of illness   Dispo: The patient is from: Home              Anticipated d/c is to: Home              Anticipated d/c date is: 1 day              Patient currently is not medically stable to d/c.  Subjective:  Allen [name] reports feeling better this morning. He reiterates that it is his goal to go home today if at all possible, but he recognizes that he is still requiring quite a bit of oxygen to maintain his respiratory status. He feels confident that he will be able to maintain sufficient PO intake to keep his HR <100.    Objective: Temp:  [98.2 F (36.8 C)-98.8 F (37.1 C)] 98.2 F (36.8 C) (01/18 2040) Pulse Rate:  [92-136] 94 (01/18 2040) Resp:  [17-40] 20 (01/18 2040) BP: (123-151)/(54-104) 123/54 (01/18 2040) SpO2:  [86 %-95 %] 93 % (01/18 2040) Physical Exam: General: Uncomfortable appearing lying in bed, [place] in place Cardiovascular: RRR, no murmur appreciated Respiratory: Lungs with diffuse wheezes throughout and bibasilar crackles Abdomen: Soft, non-tender, non-distended  Extremities: Trace pitting   Laboratory: Recent Labs  Lab 12/03/20 1020  12/04/20 0512  WBC 9.0 7.6  HGB 15.0 14.3  HCT 48.4 43.8  PLT 233 206   Recent Labs  Lab 12/03/20 1020 12/04/20 0512  NA 133* 132*  K 4.7 4.8  CL 95* 96*  CO2 25 25  BUN 25* 23  CREATININE 1.55* 1.16  CALCIUM 8.9 8.6*  PROT 7.3  --   BILITOT 0.8  --    ALKPHOS 55  --   ALT 29  --   AST 30  --   GLUCOSE 131* 99    Imaging/Diagnostic Tests: No new imaging/tests  [name], Medical Student 12/05/2020, 6:47 AM  Resident attestation: I agree with the documentation of Student Dr. [name] above. I have made adjustments to his note as appropriate. I have seen the patient and performed physical exam on the patient consistent with his documented physical exam above.  I did attempt to ambulate him as noted above.  On 5 L he was able to ambulate between the chair and the bed multiple times but then did desat into the mid 80s after this, upon sitting he did quickly regain his O2 sats into the upper 80s to low 90s at 5 L.  Patient expresses his understanding on why we are not currently discharging him though he does repeatedly requests it.  I expressed to him that if we can get his O2 requirements to improve and can get oxygen for him at home then we could consider discharge at that time.  I told him I would start the process of getting him home oxygen set up since he'll likely need this whenever he goes but could not promise this would happen today or tomorrow.  Lurline Del, DO

## 2020-12-05 NOTE — Progress Notes (Signed)
  Pt is asking how he's going home tomorrow as far as transportation. PTAR?

## 2020-12-05 NOTE — Progress Notes (Signed)
Patient Saturations on Room Air at Rest = 89%  Patient Saturations on [employer] while Ambulating = 81%  Patient Saturations on5 Liters of oxygen while Ambulating = 98%

## 2020-12-05 NOTE — Plan of Care (Signed)
  Problem: Education: Goal: Knowledge of risk factors and measures for prevention of condition will improve Outcome: Progressing   

## 2020-12-05 NOTE — Plan of Care (Signed)

## 2020-12-06 ENCOUNTER — Other Ambulatory Visit: Payer: Self-pay | Admitting: Student in an Organized Health Care Education/Training Program

## 2020-12-06 DIAGNOSIS — R0902 Hypoxemia: Secondary | ICD-10-CM

## 2020-12-06 LAB — GLUCOSE, CAPILLARY
Glucose-Capillary: 227 mg/dL — ABNORMAL HIGH (ref 70–99)
Glucose-Capillary: 131 mg/dL — ABNORMAL HIGH (ref 70–99)

## 2020-12-06 MED ORDER — TRELEGY ELLIPTA 100-62.5-25 MCG/INH IN AEPB: 1.0000 | INHALATION_SPRAY | Freq: Every day | RESPIRATORY_TRACT | 0 refills | Status: DC

## 2020-12-06 MED ORDER — ALBUTEROL SULFATE HFA 108 (90 BASE) MCG/ACT IN AERS: 2.0000 | INHALATION_SPRAY | RESPIRATORY_TRACT | 0 refills | Status: DC | PRN

## 2020-12-06 NOTE — Progress Notes (Signed)
Patient calls hospital after he was discharged [facility] complaining of respiratory difficulties and demanding breathing treatments.  We had set him up with home oxygen to maximize his success after discharge.  I have refilled his home breathing treatments of trelegy and albuterol which is what he was receiving while inpatient. Nurse told him to go to ED.  He said he would not come back here.

## 2020-12-06 NOTE — Discharge Summary (Signed)
[facility] Discharge Summary  Patient name: Allen [name] Medical record number: [id number] Date of birth: 12/02/1950 Age: 70 y.o. Gender: male Date of Admission: 12/03/2020  Date of Discharge: 12/06/2020 Admitting Physician: Richarda Osmond, DO  Primary Care Provider: Darrol Jump, NP Consultants: None  Indication for Hospitalization: Hypoxia 2/2 COVID-19 Infection  Discharge Diagnoses/Problem List:  COVID-19 Infection COPD Lung Nodules on CT HTN T2DM  Disposition: Home with Oxygen  Discharge Condition: Stable on 2L O2  Discharge Exam:  General: Pt sitting comfortably in recliner, [place] in place. No NAD Respiratory: Coarse lung sounds throughout, no increased WOB on 2L [place]. Patient able to ambulate with no increase in WOB. Cardiovascular: RRR, no M/r/g Extremities: Trace pitting edema of BLE, extremities warm and well-perfused  Brief Hospital Course:  Mr. Lorge presented to the [facility] on 1/17 with worsening shortness of breath and hypoxia one day after being diagnosed with COVID-19 at [facility]. He received remdesivir and dexamethasone per protocol and required supplemental O2 up to 6L [facility]NC. He received remdesivir, dexamethasone, and supportive care. O2 was weaned until he was breathing comfortably on 2L [place] and was able to stand and walk independently at which point he requested to return home. He was discharged with home oxygen.   Items for PCP Follow-up: 1) CT scan with multiple nodular densities measuring up to 1.4cm in L lung. Concerning for malignancy given pt's smoking history. Recommend follow-up non-con CT in 3-6 mos.  2) Given his multiple comorbidities, he is at high risk for serious disease with COVID-19. Recommend COVID vaccination in 21 days.      Significant Procedures: None  Significant Labs and Imaging:  Recent Labs  Lab 12/03/20 1020 12/04/20 0512  WBC 9.0 7.6  HGB 15.0 14.3  HCT 48.4 43.8  PLT 233 206   Recent Labs   Lab 12/03/20 1020 12/04/20 0512 12/05/20 0640  NA 133* 132* 133*  K 4.7 4.8 4.3  CL 95* 96* 97*  CO2 25 25 24   GLUCOSE 131* 99 114*  BUN 25* 23 25*  CREATININE 1.55* 1.16 1.09  CALCIUM 8.9 8.6* 8.5*  ALKPHOS 55  --   --   AST 30  --   --   ALT 29  --   --   ALBUMIN 3.6  --   --     Results/Tests Pending at Time of Discharge: None  Discharge Medications:  Allergies as of 12/06/2020   No Known Allergies     Medication List    STOP taking these medications   dexamethasone 6 MG tablet Commonly known as: DECADRON     TAKE these medications   Accu-Chek Guide test strip Generic drug: glucose blood USE TO CHECK BLOOD SUGAR TWICE DAILY BEFORE MEALS   albuterol 108 (90 Base) MCG/ACT inhaler Commonly known as: VENTOLIN HFA Inhale 2 puffs into the lungs every 4 (four) hours as needed for wheezing or shortness of breath.   furosemide 20 MG tablet Commonly known as: LASIX Take 20 mg by mouth daily as needed (leg swelling).   hydrochlorothiazide 25 MG tablet Commonly known as: HYDRODIURIL Take 25 mg by mouth daily.   lisinopril 20 MG tablet Commonly known as: ZESTRIL Take 20 mg by mouth daily.   metFORMIN 500 MG tablet Commonly known as: GLUCOPHAGE Take 500 mg by mouth daily.   omeprazole 40 MG capsule Commonly known as: PRILOSEC Take 40 mg by mouth daily as needed (acid reflux/heartburn).   oxyCODONE-acetaminophen 10-325 MG tablet Commonly known as:  PERCOCET Take 1 tablet by mouth 3 (three) times daily as needed for pain.   Trelegy Ellipta 100-62.5-25 MCG/INH Aepb Generic drug: Fluticasone-Umeclidin-Vilant Inhale 1 puff into the lungs daily.            Durable Medical Equipment  (From admission, onward)         Start     Ordered   12/05/20 1613  For home use only DME oxygen  Once       Question Answer Comment  Length of Need 6 Months   Mode or (Route) Nasal cannula   Liters per Minute 5   Frequency Continuous (stationary and portable oxygen unit  needed)   Oxygen conserving device Yes   Oxygen delivery system Gas      12/05/20 1613          Discharge Instructions: Please refer to Patient Instructions section of EMR for full details.  Patient was counseled important signs and symptoms that should prompt return to medical care, changes in medications, dietary instructions, activity restrictions, and follow up appointments.   Follow-Up Appointments: None in chart. Patient to follow-up with PCP.    [name], Medical Student 12/06/2020, 12:22 PM

## 2020-12-06 NOTE — TOC Transition Note (Signed)
Transition of Care [facility]) - CM/SW Discharge Note   Patient Details  Name: Allen [name] MRN: [id number] Date of Birth: 1951/02/16  Transition of Care [facility]) CM/SW Contact:  Angelita Ingles, RN Phone Number: [phone]  12/06/2020, 9:44 AM   Clinical Narrative:       Final next level of care: Home/Self Care Barriers to Discharge: No Barriers Identified   Patient Goals and CMS Choice Patient states their goals for this hospitalization and ongoing recovery are:: Ready to go home   Choice offered to / list presented to : NA  Discharge Placement  Transportation has been arranged via safe transport with [facility] . [place] waiver has been signed and faxed. Bedside nurse made aware that ride is downstairs right now.                      Discharge Plan and Services                DME Arranged: Oxygen DME Agency: [employer] Date DME Agency Contacted: 12/05/20 Time DME Agency Contacted: 1600 Representative spoke with at DME Agency: [place]: NA [place] Agency: NA        Social Determinants of Health ([place]) Interventions     Readmission Risk Interventions No flowsheet data found.

## 2020-12-08 LAB — CULTURE, BLOOD (ROUTINE X 2)
Culture: NO GROWTH
Culture: NO GROWTH

## 2021-01-10 ENCOUNTER — Other Ambulatory Visit: Payer: Self-pay | Admitting: Student in an Organized Health Care Education/Training Program

## 2021-01-18 ENCOUNTER — Ambulatory Visit: Payer: Medicare Other | Admitting: Sports Medicine

## 2021-01-29 ENCOUNTER — Other Ambulatory Visit: Payer: Self-pay

## 2021-01-29 ENCOUNTER — Encounter: Payer: Self-pay | Admitting: Sports Medicine

## 2021-01-29 ENCOUNTER — Ambulatory Visit (INDEPENDENT_AMBULATORY_CARE_PROVIDER_SITE_OTHER): Payer: 59 | Admitting: Sports Medicine

## 2021-01-29 DIAGNOSIS — M79675 Pain in left toe(s): Secondary | ICD-10-CM

## 2021-01-29 DIAGNOSIS — I890 Lymphedema, not elsewhere classified: Secondary | ICD-10-CM | POA: Diagnosis not present

## 2021-01-29 DIAGNOSIS — E1142 Type 2 diabetes mellitus with diabetic polyneuropathy: Secondary | ICD-10-CM

## 2021-01-29 DIAGNOSIS — B351 Tinea unguium: Secondary | ICD-10-CM

## 2021-01-29 DIAGNOSIS — M79674 Pain in right toe(s): Secondary | ICD-10-CM

## 2021-01-29 NOTE — Progress Notes (Signed)
Subjective: Allen [name] is a 70 y.o. male patient with history of diabetes who presents to office today complaining of long,mildly painful nails  while ambulating in shoes; unable to trim. Patient states that his blood sugar was 109 on the 3rd and the 8th when he checked. Saw PCP on March 8th. Denies any other pedal complaints.     Patient Active Problem List   Diagnosis Date Noted  . Hypoxemia   . COVID-19 virus infection 12/03/2020    No Known Allergies  Recent Results (from the past 2160 hour(s))  Urinalysis, Routine w reflex microscopic Urine, Clean Catch     Status: Abnormal   Collection Time: 12/03/20 10:17 AM  Result Value Ref Range   Color, Urine YELLOW YELLOW   APPearance CLEAR CLEAR   Specific Gravity, Urine 1.025 1.005 - 1.030   pH 5.0 5.0 - 8.0   Glucose, UA NEGATIVE NEGATIVE mg/dL   Hgb urine dipstick SMALL (A) NEGATIVE   Bilirubin Urine NEGATIVE NEGATIVE   Ketones, ur 5 (A) NEGATIVE mg/dL   Protein, ur 30 (A) NEGATIVE mg/dL   Nitrite NEGATIVE NEGATIVE   Leukocytes,Ua NEGATIVE NEGATIVE   RBC / HPF 0-5 0 - 5 RBC/hpf   WBC, UA 0-5 0 - 5 WBC/hpf   Bacteria, UA NONE SEEN NONE SEEN   Squamous Epithelial / LPF 0-5 0 - 5   Mucus PRESENT     Comment: Performed at [facility], [address], [place], [place] [address]  Comprehensive metabolic panel     Status: Abnormal   Collection Time: 12/03/20 10:20 AM  Result Value Ref Range   Sodium 133 (L) 135 - 145 mmol/L   Potassium 4.7 3.5 - 5.1 mmol/L   Chloride 95 (L) 98 - 111 mmol/L   CO2 25 22 - 32 mmol/L   Glucose, Bld 131 (H) 70 - 99 mg/dL    Comment: Glucose reference range applies only to samples taken after fasting for at least 8 hours.   BUN 25 (H) 8 - 23 mg/dL   Creatinine, Ser 1.55 (H) 0.61 - 1.24 mg/dL   Calcium 8.9 8.9 - 10.3 mg/dL   Total Protein 7.3 6.5 - 8.1 g/dL   Albumin 3.6 3.5 - 5.0 g/dL   AST 30 15 - 41 U/L   ALT 29 0 - 44 U/L   Alkaline Phosphatase 55 38 - 126 U/L   Total Bilirubin 0.8 0.3 -  1.2 mg/dL   GFR, Estimated 48 (L) >60 mL/min    Comment: (NOTE) Calculated using the CKD-EPI Creatinine Equation (2021)    Anion gap 13 5 - 15    Comment: Performed at [facility] [address] [place], [place] [address]  CBC with Differential     Status: Abnormal   Collection Time: 12/03/20 10:20 AM  Result Value Ref Range   WBC 9.0 4.0 - 10.5 K/uL   RBC 5.34 4.22 - 5.81 MIL/uL   Hemoglobin 15.0 13.0 - 17.0 g/dL   HCT 48.4 39.0 - 52.0 %   MCV 90.6 80.0 - 100.0 fL   MCH 28.1 26.0 - 34.0 pg   MCHC 31.0 30.0 - 36.0 g/dL   RDW 14.4 11.5 - 15.5 %   Platelets 233 150 - 400 K/uL   nRBC 0.0 0.0 - 0.2 %   Neutrophils Relative % 87 %   Neutro Abs 7.8 (H) 1.7 - 7.7 K/uL   Lymphocytes Relative 5 %   Lymphs Abs 0.4 (L) 0.7 - 4.0 K/uL  Monocytes Relative 8 %   Monocytes Absolute 0.7 0.1 - 1.0 K/uL   Eosinophils Relative 0 %   Eosinophils Absolute 0.0 0.0 - 0.5 K/uL   Basophils Relative 0 %   Basophils Absolute 0.0 0.0 - 0.1 K/uL   Immature Granulocytes 0 %   Abs Immature Granulocytes 0.04 0.00 - 0.07 K/uL    Comment: Performed at [facility], [place] [address], [place], [place] [address]  Resp Panel by RT-PCR (Flu A&B, Covid) Nasopharyngeal Swab     Status: Abnormal   Collection Time: 12/03/20 12:03 PM   Specimen: Nasopharyngeal Swab; Nasopharyngeal(NP) swabs in vial transport medium  Result Value Ref Range   SARS Coronavirus 2 by RT PCR POSITIVE (A) NEGATIVE    Comment: RESULT CALLED TO, READ BACK BY AND VERIFIED WITH: A BANKS RN [id number] 12/03/20 A BROWNING (NOTE) SARS-CoV-2 target nucleic acids are DETECTED.  The SARS-CoV-2 RNA is generally detectable in upper respiratory specimens during the acute phase of infection. Positive results are indicative of the presence of the identified virus, but do not rule out bacterial infection or co-infection with other pathogens not detected by the test. Clinical correlation with patient history and other diagnostic information is  necessary to determine patient infection status. The expected result is Negative.  Fact Sheet for Patients: EntrepreneurPulse.com.au  Fact Sheet for Healthcare Providers: IncredibleEmployment.be  This test is not yet approved or cleared by the [place] FDA and  has been authorized for detection and/or diagnosis of SARS-CoV-2 by FDA under an Emergency Use Authorization (EUA).  This EUA will remain in effect (meaning this test can b e used) for the duration of  the COVID-19 declaration under Section 564(b)(1) of the Act, 21 U.S.C. section 360bbb-3(b)(1), unless the authorization is terminated or revoked sooner.     Influenza A by PCR NEGATIVE NEGATIVE   Influenza B by PCR NEGATIVE NEGATIVE    Comment: (NOTE) The Xpert Xpress SARS-CoV-2/FLU/RSV plus assay is intended as an aid in the diagnosis of influenza from Nasopharyngeal swab specimens and should not be used as a sole basis for treatment. Nasal washings and aspirates are unacceptable for Xpert Xpress SARS-CoV-2/FLU/RSV testing.  Fact Sheet for Patients: EntrepreneurPulse.com.au  Fact Sheet for Healthcare Providers: IncredibleEmployment.be  This test is not yet approved or cleared by the [place] FDA and has been authorized for detection and/or diagnosis of SARS-CoV-2 by FDA under an Emergency Use Authorization (EUA). This EUA will remain in effect (meaning this test can be used) for the duration of the COVID-19 declaration under Section 564(b)(1) of the Act, 21 U.S.C. section 360bbb-3(b)(1), unless the authorization is terminated or revoked.  Performed at [facility], [place] [address], [place], [place] [address]   Lactic acid, plasma     Status: None   Collection Time: 12/03/20 12:13 PM  Result Value Ref Range   Lactic Acid, Venous 1.2 0.5 - 1.9 mmol/L    Comment: Performed at [facility] [address] [place], [place]  [address]  Blood Culture (routine x 2)     Status: None   Collection Time: 12/03/20 12:13 PM   Specimen: BLOOD  Result Value Ref Range   Specimen Description BLOOD LEFT ANTECUBITAL    Special Requests      BOTTLES DRAWN AEROBIC AND ANAEROBIC Blood Culture results may not be optimal due to an inadequate volume of blood received in culture bottles   Culture      NO GROWTH 5 DAYS Performed at [facility]  [facility], [place] [address], [place], [place] [address]    Report Status 12/08/2020 FINAL   Blood Culture (routine x 2)     Status: None   Collection Time: 12/03/20 12:13 PM   Specimen: BLOOD  Result Value Ref Range   Specimen Description BLOOD RIGHT ANTECUBITAL    Special Requests      BOTTLES DRAWN AEROBIC AND ANAEROBIC Blood Culture results may not be optimal due to an inadequate volume of blood received in culture bottles   Culture      NO GROWTH 5 DAYS Performed at [facility], [place] [address] [place], [place] [address]    Report Status 12/08/2020 FINAL   D-dimer, quantitative     Status: Abnormal   Collection Time: 12/03/20 12:13 PM  Result Value Ref Range   D-Dimer, Quant 1.02 (H) 0.00 - 0.50 ug/mL-FEU    Comment: (NOTE) At the manufacturer cut-off value of 0.5 g/mL FEU, this assay has a negative predictive value of 95-100%.This assay is intended for use in conjunction with a clinical pretest probability (PTP) assessment model to exclude pulmonary embolism (PE) and deep venous thrombosis (DVT) in outpatients suspected of PE or DVT. Results should be correlated with clinical presentation. Performed at [facility], [place] [address] [address] [place], [place] [address]   Procalcitonin     Status: None   Collection Time: 12/03/20 12:13 PM  Result Value Ref Range   Procalcitonin <0.10 ng/mL    Comment:        Interpretation: PCT (Procalcitonin) <= 0.5 ng/mL: Systemic infection (sepsis) is not likely. Local bacterial infection is possible. (NOTE)       Sepsis PCT Algorithm            Lower Respiratory Tract                                      Infection PCT Algorithm    ----------------------------     ----------------------------         PCT < 0.25 ng/mL                PCT < 0.10 ng/mL          Strongly encourage             Strongly discourage   discontinuation of antibiotics    initiation of antibiotics    ----------------------------     -----------------------------       PCT 0.25 - 0.50 ng/mL            PCT 0.10 - 0.25 ng/mL               OR       >80% decrease in PCT            Discourage initiation of                                            antibiotics      Encourage discontinuation           of antibiotics    ----------------------------     -----------------------------         PCT >= 0.50 ng/mL              PCT 0.26 - 0.50 ng/mL  AND        <80% decrease in PCT             Encourage initiation of                                             antibiotics       Encourage continuation           of antibiotics    ----------------------------     -----------------------------        PCT >= 0.50 ng/mL                  PCT > 0.50 ng/mL               AND         increase in PCT                  Strongly encourage                                      initiation of antibiotics    Strongly encourage escalation           of antibiotics                                     -----------------------------                                           PCT <= 0.25 ng/mL                                                 OR                                        > 80% decrease in PCT                                      Discontinue / Do not initiate                                             antibiotics  Performed at [facility], [place] [address] [place], [place] [address]   Lactate dehydrogenase     Status: Abnormal   Collection Time: 12/03/20 12:13 PM  Result Value Ref Range   LDH 193 (H) 98 - 192 U/L    Comment: Performed at [facility] [address] [place], [place] [address]  Ferritin     Status: None   Collection Time: 12/03/20 12:13 PM  Result Value Ref Range   Ferritin 143 24 - 336 ng/mL    Comment: Performed at [facility],  [address], [place], [place] [address]  Fibrinogen     Status: Abnormal   Collection Time: 12/03/20 12:13 PM  Result Value Ref Range   Fibrinogen 533 (H) 210 - 475 mg/dL    Comment: Performed at [facility] [address], [place], [place] [address]  C-reactive protein     Status: Abnormal   Collection Time: 12/03/20 12:13 PM  Result Value Ref Range   CRP 10.1 (H) <1.0 mg/dL    Comment: Performed at [facility] [address], [place], [place] [address]  Triglycerides     Status: None   Collection Time: 12/03/20 12:13 PM  Result Value Ref Range   Triglycerides 110 <150 mg/dL    Comment: Performed at [facility] [address] [place], [place] [address]  HIV Antibody (routine testing w rflx)     Status: None   Collection Time: 12/03/20  6:00 PM  Result Value Ref Range   HIV Screen 4th Generation wRfx Non Reactive Non Reactive    Comment: Performed at [facility], [place] [address] [place], [place] [address]  CBG monitoring, ED     Status: Abnormal   Collection Time: 12/04/20  3:56 AM  Result Value Ref Range   Glucose-Capillary 104 (H) 70 - 99 mg/dL    Comment: Glucose reference range applies only to samples taken after fasting for at least 8 hours.  Basic metabolic panel     Status: Abnormal   Collection Time: 12/04/20  5:12 AM  Result Value Ref Range   Sodium 132 (L) 135 - 145 mmol/L   Potassium 4.8 3.5 - 5.1 mmol/L   Chloride 96 (L) 98 - 111 mmol/L   CO2 25 22 - 32 mmol/L   Glucose, Bld 99 70 - 99 mg/dL    Comment: Glucose reference range applies only to samples taken after fasting for at least 8 hours.   BUN 23 8 - 23 mg/dL   Creatinine, Ser 1.16 0.61 - 1.24 mg/dL   Calcium 8.6 (L) 8.9 - 10.3 mg/dL   GFR, Estimated >60 >60  mL/min    Comment: (NOTE) Calculated using the CKD-EPI Creatinine Equation (2021)    Anion gap 11 5 - 15    Comment: Performed at [facility] [address], [place], [place] [address]  CBC     Status: None   Collection Time: 12/04/20  5:12 AM  Result Value Ref Range   WBC 7.6 4.0 - 10.5 K/uL   RBC 4.84 4.22 - 5.81 MIL/uL   Hemoglobin 14.3 13.0 - 17.0 g/dL   HCT 43.8 39.0 - 52.0 %   MCV 90.5 80.0 - 100.0 fL   MCH 29.5 26.0 - 34.0 pg   MCHC 32.6 30.0 - 36.0 g/dL   RDW 14.5 11.5 - 15.5 %   Platelets 206 150 - 400 K/uL   nRBC 0.0 0.0 - 0.2 %    Comment: Performed at [facility], [place] [address], [place], [place] [address]  C-reactive protein     Status: Abnormal   Collection Time: 12/04/20  5:12 AM  Result Value Ref Range   CRP 18.4 (H) <1.0 mg/dL    Comment: Performed at [facility] [address] [place], [place] [address]  D-dimer, quantitative (not at [facility])     Status: Abnormal   Collection Time: 12/04/20  5:12 AM  Result Value Ref Range   D-Dimer, Quant 0.97 (H) 0.00 -  0.50 ug/mL-FEU    Comment: (NOTE) At the manufacturer cut-off value of 0.5 g/mL FEU, this assay has a negative predictive value of 95-100%.This assay is intended for use in conjunction with a clinical pretest probability (PTP) assessment model to exclude pulmonary embolism (PE) and deep venous thrombosis (DVT) in outpatients suspected of PE or DVT. Results should be correlated with clinical presentation. Performed at [facility], [place] [address] [place], [place] [address]   Hemoglobin A1c     Status: Abnormal   Collection Time: 12/04/20  5:12 AM  Result Value Ref Range   Hgb A1c MFr Bld 7.0 (H) 4.8 - 5.6 %    Comment: (NOTE) Pre diabetes:          5.7%-6.4%  Diabetes:              >6.4%  Glycemic control for   <7.0% adults with diabetes    Mean Plasma Glucose 154.2 mg/dL    Comment: Performed at [facility] [address], [place], [place] [address]  CBG  monitoring, ED     Status: Abnormal   Collection Time: 12/04/20  7:24 AM  Result Value Ref Range   Glucose-Capillary 101 (H) 70 - 99 mg/dL    Comment: Glucose reference range applies only to samples taken after fasting for at least 8 hours.  CBG monitoring, ED     Status: Abnormal   Collection Time: 12/04/20 12:16 PM  Result Value Ref Range   Glucose-Capillary 128 (H) 70 - 99 mg/dL    Comment: Glucose reference range applies only to samples taken after fasting for at least 8 hours.  Glucose, capillary     Status: Abnormal   Collection Time: 12/04/20  4:29 PM  Result Value Ref Range   Glucose-Capillary 164 (H) 70 - 99 mg/dL    Comment: Glucose reference range applies only to samples taken after fasting for at least 8 hours.  Glucose, capillary     Status: Abnormal   Collection Time: 12/04/20  8:39 PM  Result Value Ref Range   Glucose-Capillary 189 (H) 70 - 99 mg/dL    Comment: Glucose reference range applies only to samples taken after fasting for at least 8 hours.  Basic metabolic panel Once     Status: Abnormal   Collection Time: 12/05/20  6:40 AM  Result Value Ref Range   Sodium 133 (L) 135 - 145 mmol/L   Potassium 4.3 3.5 - 5.1 mmol/L   Chloride 97 (L) 98 - 111 mmol/L   CO2 24 22 - 32 mmol/L   Glucose, Bld 114 (H) 70 - 99 mg/dL    Comment: Glucose reference range applies only to samples taken after fasting for at least 8 hours.   BUN 25 (H) 8 - 23 mg/dL   Creatinine, Ser 1.09 0.61 - 1.24 mg/dL   Calcium 8.5 (L) 8.9 - 10.3 mg/dL   GFR, Estimated >60 >60 mL/min    Comment: (NOTE) Calculated using the CKD-EPI Creatinine Equation (2021)    Anion gap 12 5 - 15    Comment: Performed at [facility] [address], [place], [place] [address]  Glucose, capillary     Status: Abnormal   Collection Time: 12/05/20  9:05 AM  Result Value Ref Range   Glucose-Capillary 207 (H) 70 - 99 mg/dL    Comment: Glucose reference range applies only to samples taken after fasting for at  least 8 hours.  Glucose, capillary     Status:  Abnormal   Collection Time: 12/05/20 12:18 PM  Result Value Ref Range   Glucose-Capillary 126 (H) 70 - 99 mg/dL    Comment: Glucose reference range applies only to samples taken after fasting for at least 8 hours.  Glucose, capillary     Status: Abnormal   Collection Time: 12/05/20  4:47 PM  Result Value Ref Range   Glucose-Capillary 208 (H) 70 - 99 mg/dL    Comment: Glucose reference range applies only to samples taken after fasting for at least 8 hours.  Glucose, capillary     Status: Abnormal   Collection Time: 12/05/20  8:27 PM  Result Value Ref Range   Glucose-Capillary 227 (H) 70 - 99 mg/dL    Comment: Glucose reference range applies only to samples taken after fasting for at least 8 hours.   Comment 1 Notify RN   Glucose, capillary     Status: Abnormal   Collection Time: 12/06/20  8:09 AM  Result Value Ref Range   Glucose-Capillary 131 (H) 70 - 99 mg/dL    Comment: Glucose reference range applies only to samples taken after fasting for at least 8 hours.    Objective: General: Patient is awake, alert, and oriented x 3 and in no acute distress.  Integument: Skin is warm, dry and supple bilateral. Nails are tender, long, thickened and dystrophic with subungual debris, consistent with onychomycosis, 1-5 bilateral. No signs of infection. +FEET ARE DIRTY ON THE BOTTOM PATIENT WEARS NO SOCKS AND WALKS AROUND BAREFOOT. No open lesions or preulcerative lesions present bilateral. Remaining integument unremarkable.  Vasculature:  Dorsalis Pedis pulse 0/4 bilateral. Posterior Tibial pulse  0/4 bilateral. Capillary fill time <5 sec 1-5 bilateral. No hair growth to the level of the digits. Temperature gradient within normal limits. No varicosities present bilateral. 2+ pitting and non pitting edema present bilateral.   Neurology: The patient has diminished sensation measured with a 5.07/10g Semmes Weinstein Monofilament at all pedal sites  bilateral . Vibratory sensation absent bilateral with tuning fork. No Babinski sign present bilateral.   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 Muscular strength 5/5 in all lower extremity muscular groups bilateral without pain on range of motion . No tenderness with calf compression bilateral.  Assessment and Plan: Problem List Items Addressed This Visit   None   Visit Diagnoses    Pain due to onychomycosis of toenails of both feet    -  Primary   Lymphedema       Diabetic polyneuropathy associated with type 2 diabetes mellitus ([place])         -Examined patient. -Discussed and educated patient on diabetic foot care, especially with regards to the vascular, neurological and musculoskeletal systems.  -Stressed the importance of good glycemic control and the detriment of not controlling glucose levels in relation to the foot. -Mechanically debrided all nails 1-5 bilateral using sterile nail nipper and filed with dremel without incident  -Advised patient to continue with lasix -Encouraged him to wear socks or to change his shoes often since his feet are dirty on the bottom -Answered all patient questions -Patient to return  in 3 months for at risk foot care -Patient advised to call the office if any problems or questions arise in the meantime.  Landis Martins, DPM

## 2021-05-01 ENCOUNTER — Ambulatory Visit: Payer: 59 | Admitting: Sports Medicine

## 2021-06-11 ENCOUNTER — Encounter: Payer: Self-pay | Admitting: Sports Medicine

## 2021-06-11 ENCOUNTER — Other Ambulatory Visit: Payer: Self-pay

## 2021-06-11 ENCOUNTER — Ambulatory Visit (INDEPENDENT_AMBULATORY_CARE_PROVIDER_SITE_OTHER): Payer: 59 | Admitting: Sports Medicine

## 2021-06-11 DIAGNOSIS — I890 Lymphedema, not elsewhere classified: Secondary | ICD-10-CM

## 2021-06-11 DIAGNOSIS — M79675 Pain in left toe(s): Secondary | ICD-10-CM | POA: Diagnosis not present

## 2021-06-11 DIAGNOSIS — B351 Tinea unguium: Secondary | ICD-10-CM

## 2021-06-11 DIAGNOSIS — E1142 Type 2 diabetes mellitus with diabetic polyneuropathy: Secondary | ICD-10-CM | POA: Diagnosis not present

## 2021-06-11 DIAGNOSIS — M79674 Pain in right toe(s): Secondary | ICD-10-CM

## 2021-06-11 NOTE — Progress Notes (Signed)
Subjective: Allen [name] is a 70 y.o. male patient with history of diabetes who presents to office today complaining of long,mildly painful nails  while ambulating in shoes; unable to trim.  Patient reports his blood sugar was not recorded.  No other pedal complaints noted.  Patient Active Problem List   Diagnosis Date Noted   Hypoxemia    COVID-19 virus infection 12/03/2020    No Known Allergies  No results found for this or any previous visit (from the past 2160 hour(s)).   Objective: General: Patient is awake, alert, and oriented x 3 and in no acute distress.  Integument: Skin is warm, dry and supple bilateral. Nails are tender, long, thickened and dystrophic with subungual debris, consistent with onychomycosis, 1-5 bilateral. No signs of infection. +FEET ARE DIRTY ON THE BOTTOM PATIENT WEARS NO SOCKS AND WALKS AROUND BAREFOOT. No open lesions or preulcerative lesions present bilateral. Remaining integument unremarkable.  Vasculature:  Dorsalis Pedis pulse 0/4 bilateral. Posterior Tibial pulse  0/4 bilateral. Capillary fill time <5 sec 1-5 bilateral. No hair growth to the level of the digits. Temperature gradient within normal limits. No varicosities present bilateral. 2+ pitting and non pitting edema present bilateral.   Neurology: The patient has diminished sensation measured with a 5.07/10g Semmes Weinstein Monofilament at all pedal sites bilateral . Vibratory sensation absent bilateral with tuning fork. No Babinski sign present bilateral.   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 Muscular strength 5/5 in all lower extremity muscular groups bilateral without pain on range of motion . No tenderness with calf compression bilateral.  Assessment and Plan: Problem List Items Addressed This Visit   None Visit Diagnoses     Pain due to onychomycosis of toenails of both feet    -  Primary   Lymphedema       Diabetic polyneuropathy associated with type  2 diabetes mellitus ([place])          -Examined patient. -Discussed and educated patient on diabetic foot care, especially with regards to the vascular, neurological and musculoskeletal systems.  -Advised patient to try to wear socks or get new insoles for his shoes since current ones are dirty -Mechanically debrided all nails 1-5 bilateral using sterile nail nipper and filed with dremel without incident  -Advised patient to continue with lasix for edema control -Answered all patient questions -Patient to return  in 3 months for at risk foot care -Patient advised to call the office if any problems or questions arise in the meantime.  Landis Martins, DPM

## 2021-09-13 ENCOUNTER — Ambulatory Visit: Payer: 59 | Admitting: Sports Medicine

## 2021-09-24 ENCOUNTER — Other Ambulatory Visit: Payer: Self-pay

## 2021-09-24 ENCOUNTER — Ambulatory Visit (INDEPENDENT_AMBULATORY_CARE_PROVIDER_SITE_OTHER): Payer: 59 | Admitting: Sports Medicine

## 2021-09-24 ENCOUNTER — Encounter: Payer: Self-pay | Admitting: Sports Medicine

## 2021-09-24 DIAGNOSIS — M79675 Pain in left toe(s): Secondary | ICD-10-CM | POA: Diagnosis not present

## 2021-09-24 DIAGNOSIS — M79674 Pain in right toe(s): Secondary | ICD-10-CM | POA: Diagnosis not present

## 2021-09-24 DIAGNOSIS — E1142 Type 2 diabetes mellitus with diabetic polyneuropathy: Secondary | ICD-10-CM

## 2021-09-24 DIAGNOSIS — B351 Tinea unguium: Secondary | ICD-10-CM | POA: Diagnosis not present

## 2021-09-24 DIAGNOSIS — I890 Lymphedema, not elsewhere classified: Secondary | ICD-10-CM

## 2021-09-24 NOTE — Progress Notes (Signed)
Subjective: Allen [name] is a 70 y.o. male patient with history of diabetes who presents to office today complaining of long,mildly painful nails  while ambulating in shoes; unable to trim.  Patient reports his blood sugar was not recorded.  Last A1c unknown last visit to PCP was 09/18/2021.  No other pedal complaints noted.  Reports that his sister is not doing well.  Patient Active Problem List   Diagnosis Date Noted   Hypoxemia    COVID-19 virus infection 12/03/2020    No Known Allergies  No results found for this or any previous visit (from the past 2160 hour(s)).   Objective: General: Patient is awake, alert, and oriented x 3 and in no acute distress.  Integument: Skin is warm, dry and supple bilateral. Nails are tender, long, thickened and dystrophic with subungual debris, consistent with onychomycosis, 1-5 bilateral. No signs of infection. +FEET ARE DIRTY ON THE BOTTOM PATIENT WEARS NO SOCKS AND WALKS AROUND BAREFOOT.  Unchanged from prior.  No open lesions or preulcerative lesions present bilateral. Remaining integument unremarkable.  Vasculature:  Dorsalis Pedis pulse 0/4 bilateral. Posterior Tibial pulse  0/4 bilateral. Capillary fill time <5 sec 1-5 bilateral. No hair growth to the level of the digits. Temperature gradient within normal limits. No varicosities present bilateral. 2+ pitting and non pitting edema present bilateral.   Neurology: The patient has diminished sensation measured with a 5.07/10g Semmes Weinstein Monofilament at all pedal sites bilateral . Vibratory sensation absent bilateral with tuning fork. No Babinski sign present bilateral.   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 Muscular strength 5/5 in all lower extremity muscular groups bilateral without pain on range of motion . No tenderness with calf compression bilateral.  Assessment and Plan: Problem List Items Addressed This Visit   None Visit Diagnoses     Pain due  to onychomycosis of toenails of both feet    -  Primary   Lymphedema       Diabetic polyneuropathy associated with type 2 diabetes mellitus ([place])          -Examined patient. -Re-Discussed and educated patient on diabetic foot care, especially with regards to the vascular, neurological and musculoskeletal systems.  -Mechanically debrided all nails 1-5 bilateral using sterile nail nipper and filed with dremel without incident  -Advised patient to continue with lasix for edema control like previous -Patient continues to wear shoes without socks and has dirt like before on bottom of the feet -Answered all patient questions -Patient to return  in 3 months for at risk foot care -Patient advised to call the office if any problems or questions arise in the meantime.  Landis Martins, DPM

## 2021-12-27 ENCOUNTER — Ambulatory Visit (INDEPENDENT_AMBULATORY_CARE_PROVIDER_SITE_OTHER): Payer: 59 | Admitting: Sports Medicine

## 2021-12-27 ENCOUNTER — Encounter: Payer: Self-pay | Admitting: Sports Medicine

## 2021-12-27 DIAGNOSIS — M79674 Pain in right toe(s): Secondary | ICD-10-CM

## 2021-12-27 DIAGNOSIS — E1142 Type 2 diabetes mellitus with diabetic polyneuropathy: Secondary | ICD-10-CM

## 2021-12-27 DIAGNOSIS — M79675 Pain in left toe(s): Secondary | ICD-10-CM | POA: Diagnosis not present

## 2021-12-27 DIAGNOSIS — B351 Tinea unguium: Secondary | ICD-10-CM | POA: Diagnosis not present

## 2021-12-27 DIAGNOSIS — I890 Lymphedema, not elsewhere classified: Secondary | ICD-10-CM

## 2021-12-27 NOTE — Progress Notes (Signed)
Subjective: Allen [name] is a 71 y.o. male patient with history of diabetes who presents to office today complaining of long,mildly painful nails  while ambulating in shoes; unable to trim.  Patient reports his blood sugar was 115.  Last A1c unknown last visit to PCP was 12/13/2021.  No other pedal complaints noted.  Reports that his sister is not doing well.  Patient Active Problem List   Diagnosis Date Noted   Hypoxemia    COVID-19 virus infection 12/03/2020    No Known Allergies  No results found for this or any previous visit (from the past 2160 hour(s)).   Objective: General: Patient is awake, alert, and oriented x 3 and in no acute distress.  Short of breath this morning which is baseline for the patient per his report.  Integument: Skin is warm, dry and supple bilateral. Nails are tender, long, thickened and dystrophic with subungual debris, consistent with onychomycosis, 1-5 bilateral. No signs of infection. +FEET ARE DIRTY ON THE BOTTOM PATIENT  WALKS AROUND BAREFOOT.  As previously noted.  No open lesions or preulcerative lesions present bilateral. Remaining integument unremarkable.  Vasculature:  Dorsalis Pedis pulse 0/4 bilateral. Posterior Tibial pulse  0/4 bilateral. Capillary fill time <5 sec 1-5 bilateral. No hair growth to the level of the digits. Temperature gradient within normal limits. No varicosities present bilateral. 2+ pitting and non pitting edema present bilateral.   Neurology: The patient has diminished sensation measured with a 5.07/10g Semmes Weinstein Monofilament at all pedal sites bilateral . Vibratory sensation absent bilateral with tuning fork. No Babinski sign present bilateral.   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 Muscular strength 5/5 in all lower extremity muscular groups bilateral without pain on range of motion . No tenderness with calf compression bilateral.  Assessment and Plan: Problem List Items Addressed  This Visit   None Visit Diagnoses     Pain due to onychomycosis of toenails of both feet    -  Primary   Lymphedema       Diabetic polyneuropathy associated with type 2 diabetes mellitus ([place])          -Examined patient. -Re-Discussed and educated patient on diabetic foot care, especially with regards to the vascular, neurological and musculoskeletal systems.  -Mechanically debrided all nails 1-5 bilateral using sterile nail nipper and filed with dremel without incident  -Advised patient to continue with lasix for edema control like previous and if shortness of breath is not relieved he should go to the hospital -Answered all patient questions -Patient to return  in 3 months for at risk foot care -Patient advised to call the office if any problems or questions arise in the meantime.  Landis Martins, DPM

## 2022-03-26 ENCOUNTER — Encounter: Payer: Self-pay | Admitting: Sports Medicine

## 2022-03-26 ENCOUNTER — Ambulatory Visit (INDEPENDENT_AMBULATORY_CARE_PROVIDER_SITE_OTHER): Payer: 59 | Admitting: Sports Medicine

## 2022-03-26 DIAGNOSIS — M79675 Pain in left toe(s): Secondary | ICD-10-CM | POA: Diagnosis not present

## 2022-03-26 DIAGNOSIS — M79674 Pain in right toe(s): Secondary | ICD-10-CM | POA: Diagnosis not present

## 2022-03-26 DIAGNOSIS — E1142 Type 2 diabetes mellitus with diabetic polyneuropathy: Secondary | ICD-10-CM

## 2022-03-26 DIAGNOSIS — I890 Lymphedema, not elsewhere classified: Secondary | ICD-10-CM

## 2022-03-26 DIAGNOSIS — B351 Tinea unguium: Secondary | ICD-10-CM

## 2022-03-26 NOTE — Progress Notes (Signed)
Subjective: ?Allen [name] is a 71 y.o. male patient with history of diabetes who presents to office today complaining of long,mildly painful nails  while ambulating in shoes; unable to trim.  Patient reports his blood sugar was 115.  Last A1c unknown last visit to PCP was 12/13/2021 but his PCP Darrol Jump, NP has left the practice but not sure who he will have when he go [name] the 25th. No other pedal complaints noted. ? ? ?Patient Active Problem List  ? Diagnosis Date Noted  ? Hypoxemia   ? COVID-19 virus infection 12/03/2020  ? ? ?No Known Allergies ? ?No results found for this or any previous visit (from the past 2160 hour(s)). ? ? ?Objective: ?General: Patient is awake, alert, and oriented x 3 and in no acute distress.  Short of breath this morning which is baseline for the patient per his report. ? ?Integument: Skin is warm, dry and supple bilateral. Nails are tender, long, thickened and dystrophic with subungual debris, consistent with onychomycosis, 1-5 bilateral. No signs of infection. No open lesions or preulcerative lesions present bilateral. Remaining integument unremarkable. ? ?Vasculature:  Dorsalis Pedis pulse 0/4 bilateral. Posterior Tibial pulse  0/4 bilateral. Capillary fill time <5 sec 1-5 bilateral. No hair growth to the level of the digits. ?Temperature gradient within normal limits. No varicosities present bilateral. Trace edema bilateral.  ? ?Neurology: The patient has diminished sensation measured with a 5.07/10g Semmes Weinstein Monofilament at all pedal sites bilateral . Vibratory sensation absent bilateral with tuning fork. No Babinski sign present bilateral. Unchanged from prior.  ? ?Musculoskeletal: Asymptomatic pes planus pedal deformities noted bilateral. Muscular strength 5/5 in all lower extremity muscular groups bilateral without pain [name] range of motion . No tenderness with calf compression bilateral. ? ?Assessment and Plan: ?Problem List Items Addressed This Visit   ?None ?Visit  Diagnoses   ? ? Pain due to onychomycosis of toenails of both feet    -  Primary  ? Lymphedema      ? Diabetic polyneuropathy associated with type 2 diabetes mellitus ([place])      ? ?  ? ?-Examined patient. ?-Re-Discussed and educated patient [name] diabetic foot care, especially with regards to the vascular, neurological and musculoskeletal systems.  ?-Mechanically debrided all painful nails 1-5 bilateral using sterile nail nipper and filed with dremel without incident  ?-Encouraged elevation for edema control and continue with lasix  ?-Answered all patient questions ?-Patient to return  in 3 months for at risk foot care ?-Patient advised to call the office if any problems or questions arise in the meantime. ? ?Landis Martins, DPM ?

## 2022-06-06 LAB — PULMONARY FUNCTION TEST

## 2022-06-17 DIAGNOSIS — J189 Pneumonia, unspecified organism: Secondary | ICD-10-CM

## 2022-06-17 HISTORY — DX: Pneumonia, unspecified organism: J18.9

## 2022-06-19 ENCOUNTER — Encounter: Payer: Self-pay | Admitting: Podiatry

## 2022-06-19 ENCOUNTER — Ambulatory Visit (INDEPENDENT_AMBULATORY_CARE_PROVIDER_SITE_OTHER): Payer: Medicare Other | Admitting: Podiatry

## 2022-06-19 DIAGNOSIS — M79675 Pain in left toe(s): Secondary | ICD-10-CM | POA: Diagnosis not present

## 2022-06-19 DIAGNOSIS — E1142 Type 2 diabetes mellitus with diabetic polyneuropathy: Secondary | ICD-10-CM | POA: Diagnosis not present

## 2022-06-19 DIAGNOSIS — M79674 Pain in right toe(s): Secondary | ICD-10-CM

## 2022-06-19 DIAGNOSIS — B351 Tinea unguium: Secondary | ICD-10-CM | POA: Diagnosis not present

## 2022-06-19 NOTE — Progress Notes (Signed)
  Subjective:  Patient ID: Allen [name], male    DOB: [birth year],  MRN: [id number]  Allen [name] presents to clinic today for at risk foot care with history of diabetic neuropathy and painful elongated mycotic toenails 1-5 bilaterally which are tender when wearing enclosed shoe gear. Pain is relieved with periodic professional debridement.  Last known HgA1c was unknown.  Patient did not check blood glucose today.  New problem(s): None.   PCP is [facility] , and last visit was  Apr 10, 2022  No Known Allergies  Review of Systems: Negative except as noted in the HPI.  Objective: No changes noted in today's physical examination.  Vascular Examination: CFT <4 seconds b/l. DP pulses diminished b/l. PT pulses diminished b/l. Digital hair absent. Skin temperature gradient warm to cool b/l. No ischemia or gangrene. No cyanosis or clubbing noted b/l. Trace edema noted BLE.   Neurological Examination: Protective sensation diminished with 10g monofilament b/l. Vibratory sensation diminished b/l.  Dermatological Examination: Pedal skin is warm and supple b/l LE. No open wounds b/l LE. No interdigital macerations noted b/l LE. Toenails 1-5 b/l elongated, discolored, dystrophic, thickened, crumbly with subungual debris and tenderness to dorsal palpation. No hyperkeratotic nor porokeratotic lesions present on today's visit.  Musculoskeletal Examination: Normal muscle strength 5/5 to all lower extremity muscle groups bilaterally. Pes planus deformity noted bilateral LE.Marland Kitchen No pain, crepitus or joint limitation noted with ROM b/l LE.  Patient ambulates independently without assistive aids.  Radiographs: None  Assessment/Plan: 1. Pain due to onychomycosis of toenails of both feet   2. Diabetic polyneuropathy associated with type 2 diabetes mellitus ([place])      -Patient was evaluated and treated. All patient's and/or POA's questions/concerns answered on today's  visit. -Continue foot and shoe inspections daily. Monitor blood glucose per PCP/Endocrinologist's recommendations. -Patient to continue soft, supportive shoe gear daily. -Mycotic toenails 1-5 bilaterally were debrided in length and girth with sterile nail nippers and dremel without incident. -Patient/POA to call should there be question/concern in the interim.   Return in about 3 months (around 09/19/2022).  Marzetta Board, DPM

## 2022-06-23 ENCOUNTER — Ambulatory Visit: Payer: 59 | Admitting: Podiatry

## 2022-07-01 ENCOUNTER — Other Ambulatory Visit: Payer: Self-pay | Admitting: Thoracic Surgery (Cardiothoracic Vascular Surgery)

## 2022-07-01 ENCOUNTER — Other Ambulatory Visit: Payer: Self-pay | Admitting: *Deleted

## 2022-07-01 ENCOUNTER — Encounter: Payer: Self-pay | Admitting: Thoracic Surgery (Cardiothoracic Vascular Surgery)

## 2022-07-01 ENCOUNTER — Institutional Professional Consult (permissible substitution) (INDEPENDENT_AMBULATORY_CARE_PROVIDER_SITE_OTHER): Payer: Medicare Other | Admitting: Thoracic Surgery (Cardiothoracic Vascular Surgery)

## 2022-07-01 VITALS — BP 128/66 | HR 120 | Resp 20 | Ht 75.0 in | Wt 229.1 lb

## 2022-07-01 DIAGNOSIS — R911 Solitary pulmonary nodule: Secondary | ICD-10-CM

## 2022-07-01 DIAGNOSIS — R590 Localized enlarged lymph nodes: Secondary | ICD-10-CM

## 2022-07-01 DIAGNOSIS — I2510 Atherosclerotic heart disease of native coronary artery without angina pectoris: Secondary | ICD-10-CM

## 2022-07-01 DIAGNOSIS — Z01818 Encounter for other preprocedural examination: Secondary | ICD-10-CM

## 2022-07-01 NOTE — Progress Notes (Signed)
PCP is [facility] Referring Provider is Gardiner Rhyme, MD  Chief Complaint  Patient presents with   Lung Lesion    Surgical consult, PET Scan 05/27/22/ Chest CT 04/21/22/PFT's 06/06/22    HPI: Allen [name] is sent for consultation regarding a left upper lobe lung nodule.  Allen [name] is a 71 year old man with a history of tobacco abuse, COPD, hypertension, type 2 diabetes, thoracic aortic atherosclerosis and coronary atherosclerosis.  Allen [name] smoked 2 packs a day for about 60 years.  Allen [name] is currently down to about 1 pack a day.  Allen [name] has a low-dose CT for lung cancer screening in June.  It showed a 16 mm nodule in the left upper lobe.  Allen [name] saw Dr. Alcide Clever.  A PET/CT showed the nodule was hypermetabolic with an SUV of 4.4.  There was an area of uptake in the left suprahilar region with an SUV measuring 4.7 and then a hypermetabolic left paratracheal node which measured 9 mm with an SUV of 4.4.  Allen [name] denies any chest pain, pressure, or tightness.  Allen [name] does get short of breath both at rest and with exertion.  Allen [name] got very short of breath walking into the office from the parking lot.  Can only tolerate very minimal activity.  Allen [name]: At the time of surgery this patient's most appropriate activity status/level should be described as: []     0    Normal activity, no symptoms []     1    Restricted in physical strenuous activity but ambulatory, able to do out light work [x]     2    Ambulatory and capable of self care, unable to do work activities, up and about >50 % of waking hours                              []     3    Only limited self care, in bed greater than 50% of waking hours []     4    Completely disabled, no self care, confined to bed or chair []     5    Moribund  Past Medical History:  Diagnosis Date   COPD (chronic obstructive pulmonary disease) ([place])    Diabetes mellitus without complication ([place])    Hypertension     History reviewed. No pertinent  surgical history.  History reviewed. No pertinent family history.  Social History Social History   Tobacco Use   Smoking status: Every Day    Current Outpatient Medications  Medication Sig Dispense Refill   ACCU-CHEK GUIDE test strip USE TO CHECK BLOOD SUGAR TWICE DAILY BEFORE MEALS     albuterol (VENTOLIN HFA) 108 (90 Base) MCG/ACT inhaler Inhale 2 puffs into the lungs every 4 (four) hours as needed for wheezing or shortness of breath. 18 g 0   FARXIGA 10 MG TABS tablet Take 10 mg by mouth daily.     Fluticasone-Umeclidin-Vilant (TRELEGY ELLIPTA) 100-62.5-25 MCG/INH AEPB Inhale 1 puff into the lungs daily. 1 each 0   furosemide (LASIX) 20 MG tablet Take 20 mg by mouth daily as needed (leg swelling).     hydrochlorothiazide (HYDRODIURIL) 25 MG tablet Take 25 mg by mouth daily.     ipratropium-albuterol (DUONEB) 0.5-2.5 (3) MG/3ML SOLN SMARTSIG:1 Vial(s) Via Nebulizer 4 Times Daily PRN     lisinopril (ZESTRIL) 20 MG tablet Take 20 mg by mouth daily.     metFORMIN (GLUCOPHAGE) 500 MG tablet  Take 500 mg by mouth daily.     omeprazole (PRILOSEC) 40 MG capsule Take 40 mg by mouth daily as needed (acid reflux/heartburn).     oxyCODONE-acetaminophen (PERCOCET) 10-325 MG tablet Take 1 tablet by mouth 3 (three) times daily as needed for pain.     permethrin (ELIMITE) 5 % cream Apply topically.     No current facility-administered medications for this visit.    No Known Allergies  Review of Systems  Constitutional:  Positive for fatigue. Negative for activity change and unexpected weight change.  Respiratory:  Positive for cough, shortness of breath and wheezing.   Cardiovascular:  Positive for leg swelling. Negative for chest pain.  Neurological:  Negative for seizures and weakness.  Hematological:  Negative for adenopathy. Does not bruise/bleed easily.  All other systems reviewed and are negative.   BP 128/66 (BP Location: Right Arm, Patient Position: Sitting, Cuff Size: Normal)    Pulse (!) 120   Resp 20   Ht 6\' 3"  (1.905 m)   Wt 229 lb 1.9 oz (103.9 kg)   SpO2 91% Comment: RA  BMI 28.64 kg/m  Physical Exam Vitals reviewed.  Constitutional:      General: Allen [name] is not in acute distress.    Appearance: Normal appearance. Allen [name] is obese.  HENT:     Head: Normocephalic and atraumatic.  Eyes:     General: No scleral icterus.    Extraocular Movements: Extraocular movements intact.  Neck:     Vascular: No carotid bruit.  Cardiovascular:     Rate and Rhythm: Regular rhythm. Tachycardia present.     Heart sounds: No murmur heard. Pulmonary:     Effort: Pulmonary effort is normal.     Breath sounds: Wheezing (Faint bilaterally) present.     Comments: Diminished breath sounds bilaterally Abdominal:     General: There is no distension.     Palpations: Abdomen is soft.  Musculoskeletal:     Right lower leg: Edema (Trace) present.     Left lower leg: Edema (Trace) present.  Lymphadenopathy:     Cervical: No cervical adenopathy.  Skin:    General: Skin is warm and dry.  Neurological:     General: No focal deficit present.     Mental Status: Allen [name] is alert and oriented to person, place, and time.     Cranial Nerves: No cranial nerve deficit.     Motor: No weakness.    Diagnostic Tests: PET/CT 05/28/2022 Impression 1.  The 2.0 x 0.9 cm left upper lobe nodule has a maximum SUV of 4.4, suspicious for malignancy. 2.  Also mildly hypermetabolic left paratracheal left upper lobe suprahilar nodes, suspicious for potential involvement. 3.  Mildly accentuated peripheral left prostate gland activity, cannot exclude prostate malignancy.  Correlate with prostate history. 4.  Other imaging findings of potential clinical significance: Aortic atherosclerosis and emphysema.  Coronary atherosclerosis.  Airway thickening is present suggesting bronchitis or reactive airways disease.  Atypical infectious bronchiolitis in the right middle lobe.  Electronically signed By: Van Clines, MD On: 05/28/2022 14:59  I personally reviewed the CT images.  Findings include a hypermetabolic left upper lobe nodule, hypermetabolism in the left suprahilar region, hypermetabolic 4L node, severe coronary and thoracic aortic atherosclerosis.  Impression: Allen [name] is a 71 year old man with a history of tobacco abuse, COPD, hypertension, type 2 diabetes, thoracic aortic atherosclerosis and coronary atherosclerosis.  Allen [name] smoked 2 packs a day for about 60 years.  Allen [name] is currently down to about 1 pack a  day.  Allen [name] had a low-dose screening CT for lung cancer in June which showed a left upper lobe lung nodule.  A PET/CT showed the nodule was hypermetabolic.  There also was hypermetabolic activity in the right suprahilar region as well as a level 4L paratracheal node.    Findings are concerning for a new primary bronchogenic carcinoma.  Clinical stage would be IIIA (T1, N2).  Infectious or inflammatory nodules are also in the differential diagnosis, but given his smoking history we have to consider this a lung cancer unless we can prove otherwise.  I reviewed the CT images with Allen [name] and his brother.  We discussed the differential diagnosis.  We discussed potential treatment options.  Allen [name] is dyspneic with minimal activity and his heart rate went up to 120 just with walking in from the parking lot.  FEV1 is 28% predicted and diffusion capacity is only about 30% of predicted.  Allen [name] is not a candidate for surgical resection.  I recommended that we proceed with a navigational bronchoscopy and endobronchial ultrasound for diagnostic and staging purposes.  Allen [name] and his brother understand this is an endoscopic procedure but would be done under general anesthesia.  I informed them of the indications, risks, benefits, and alternatives.  They understand the risks include those associated with general anesthesia.  They understand procedure specific risks include failure to make a diagnosis,  pneumothorax, and bleeding, as well as possibility of other unforeseeable risks.  Allen [name] accepts the risk and wishes to proceed.  Allen [name] will need a super dimension protocol CT for planning for navigational bronchoscopy.  We will see if we can do that at [facility]  Thoracic and coronary atherosclerosis by CT-very extensive CAD.  No anginal symptoms.  Type 2 diabetes-on metformin and Farxiga  Plan: superDimension CT for planning for navigational bronchoscopy. Navigational bronchoscopy and endobronchial ultrasound on Wednesday, 07/09/2022  Melrose Nakayama, MD [facility] [phone]

## 2022-07-01 NOTE — H&P (View-Only) (Signed)
PCP is [facility] Referring Provider is Gardiner Rhyme, MD  Chief Complaint  Patient presents with   Lung Lesion    Surgical consult, PET Scan 05/27/22/ Chest CT 04/21/22/PFT's 06/06/22    HPI: [name] is sent for consultation regarding a left upper lobe lung nodule.  Allen [name] is a 71 year old man with a history of tobacco abuse, COPD, hypertension, type 2 diabetes, thoracic aortic atherosclerosis and coronary atherosclerosis.  He smoked 2 packs a day for about 60 years.  He is currently down to about 1 pack a day.  He has a low-dose CT for lung cancer screening in June.  It showed a 16 mm nodule in the left upper lobe.  He saw Dr. Alcide Clever.  A PET/CT showed the nodule was hypermetabolic with an SUV of 4.4.  There was an area of uptake in the left suprahilar region with an SUV measuring 4.7 and then a hypermetabolic left paratracheal node which measured 9 mm with an SUV of 4.4.  He denies any chest pain, pressure, or tightness.  He does get short of breath both at rest and with exertion.  He got very short of breath walking into the office from the parking lot.  Can only tolerate very minimal activity.  Zubrod Score: At the time of surgery this patient's most appropriate activity status/level should be described as: []     0    Normal activity, no symptoms []     1    Restricted in physical strenuous activity but ambulatory, able to do out light work [x]     2    Ambulatory and capable of self care, unable to do work activities, up and about >50 % of waking hours                              []     3    Only limited self care, in bed greater than 50% of waking hours []     4    Completely disabled, no self care, confined to bed or chair []     5    Moribund  Past Medical History:  Diagnosis Date   COPD (chronic obstructive pulmonary disease) ([place])    Diabetes mellitus without complication ([place])    Hypertension     History reviewed. No pertinent  surgical history.  History reviewed. No pertinent family history.  Social History Social History   Tobacco Use   Smoking status: Every Day    Current Outpatient Medications  Medication Sig Dispense Refill   ACCU-CHEK GUIDE test strip USE TO CHECK BLOOD SUGAR TWICE DAILY BEFORE MEALS     albuterol (VENTOLIN HFA) 108 (90 Base) MCG/ACT inhaler Inhale 2 puffs into the lungs every 4 (four) hours as needed for wheezing or shortness of breath. 18 g 0   FARXIGA 10 MG TABS tablet Take 10 mg by mouth daily.     Fluticasone-Umeclidin-Vilant (TRELEGY ELLIPTA) 100-62.5-25 MCG/INH AEPB Inhale 1 puff into the lungs daily. 1 each 0   furosemide (LASIX) 20 MG tablet Take 20 mg by mouth daily as needed (leg swelling).     hydrochlorothiazide (HYDRODIURIL) 25 MG tablet Take 25 mg by mouth daily.     ipratropium-albuterol (DUONEB) 0.5-2.5 (3) MG/3ML SOLN SMARTSIG:1 Vial(s) Via Nebulizer 4 Times Daily PRN     lisinopril (ZESTRIL) 20 MG tablet Take 20 mg by mouth daily.     metFORMIN (GLUCOPHAGE) 500 MG tablet  Take 500 mg by mouth daily.     omeprazole (PRILOSEC) 40 MG capsule Take 40 mg by mouth daily as needed (acid reflux/heartburn).     oxyCODONE-acetaminophen (PERCOCET) 10-325 MG tablet Take 1 tablet by mouth 3 (three) times daily as needed for pain.     permethrin (ELIMITE) 5 % cream Apply topically.     No current facility-administered medications for this visit.    No Known Allergies  Review of Systems  Constitutional:  Positive for fatigue. Negative for activity change and unexpected weight change.  Respiratory:  Positive for cough, shortness of breath and wheezing.   Cardiovascular:  Positive for leg swelling. Negative for chest pain.  Neurological:  Negative for seizures and weakness.  Hematological:  Negative for adenopathy. Does not bruise/bleed easily.  All other systems reviewed and are negative.   BP 128/66 (BP Location: Right Arm, Patient Position: Sitting, Cuff Size: Normal)    Pulse (!) 120   Resp 20   Ht 6\' 3"  (1.905 m)   Wt 229 lb 1.9 oz (103.9 kg)   SpO2 91% Comment: RA  BMI 28.64 kg/m  Physical Exam Vitals reviewed.  Constitutional:      General: He is not in acute distress.    Appearance: Normal appearance. He is obese.  HENT:     Head: Normocephalic and atraumatic.  Eyes:     General: No scleral icterus.    Extraocular Movements: Extraocular movements intact.  Neck:     Vascular: No carotid bruit.  Cardiovascular:     Rate and Rhythm: Regular rhythm. Tachycardia present.     Heart sounds: No murmur heard. Pulmonary:     Effort: Pulmonary effort is normal.     Breath sounds: Wheezing (Faint bilaterally) present.     Comments: Diminished breath sounds bilaterally Abdominal:     General: There is no distension.     Palpations: Abdomen is soft.  Musculoskeletal:     Right lower leg: Edema (Trace) present.     Left lower leg: Edema (Trace) present.  Lymphadenopathy:     Cervical: No cervical adenopathy.  Skin:    General: Skin is warm and dry.  Neurological:     General: No focal deficit present.     Mental Status: He is alert and oriented to person, place, and time.     Cranial Nerves: No cranial nerve deficit.     Motor: No weakness.    Diagnostic Tests: PET/CT 05/28/2022 Impression 1.  The 2.0 x 0.9 cm left upper lobe nodule has a maximum SUV of 4.4, suspicious for malignancy. 2.  Also mildly hypermetabolic left paratracheal left upper lobe suprahilar nodes, suspicious for potential involvement. 3.  Mildly accentuated peripheral left prostate gland activity, cannot exclude prostate malignancy.  Correlate with prostate history. 4.  Other imaging findings of potential clinical significance: Aortic atherosclerosis and emphysema.  Coronary atherosclerosis.  Airway thickening is present suggesting bronchitis or reactive airways disease.  Atypical infectious bronchiolitis in the right middle lobe.  Electronically signed By: Van Clines, MD On: 05/28/2022 14:59  I personally reviewed the CT images.  Findings include a hypermetabolic left upper lobe nodule, hypermetabolism in the left suprahilar region, hypermetabolic 4L node, severe coronary and thoracic aortic atherosclerosis.  Impression: Allen [name] is a 71 year old man with a history of tobacco abuse, COPD, hypertension, type 2 diabetes, thoracic aortic atherosclerosis and coronary atherosclerosis.  He smoked 2 packs a day for about 60 years.  He is currently down to about 1 pack a  day.  He had a low-dose screening CT for lung cancer in June which showed a left upper lobe lung nodule.  A PET/CT showed the nodule was hypermetabolic.  There also was hypermetabolic activity in the right suprahilar region as well as a level 4L paratracheal node.    Findings are concerning for a new primary bronchogenic carcinoma.  Clinical stage would be IIIA (T1, N2).  Infectious or inflammatory nodules are also in the differential diagnosis, but given his smoking history we have to consider this a lung cancer unless we can prove otherwise.  I reviewed the CT images with Allen [name] and his brother.  We discussed the differential diagnosis.  We discussed potential treatment options.  He is dyspneic with minimal activity and his heart rate went up to 120 just with walking in from the parking lot.  FEV1 is 28% predicted and diffusion capacity is only about 30% of predicted.  He is not a candidate for surgical resection.  I recommended that we proceed with a navigational bronchoscopy and endobronchial ultrasound for diagnostic and staging purposes.  Allen [name] and his brother understand this is an endoscopic procedure but would be done under general anesthesia.  I informed them of the indications, risks, benefits, and alternatives.  They understand the risks include those associated with general anesthesia.  They understand procedure specific risks include failure to make a diagnosis,  pneumothorax, and bleeding, as well as possibility of other unforeseeable risks.  He accepts the risk and wishes to proceed.  He will need a super dimension protocol CT for planning for navigational bronchoscopy.  We will see if we can do that at [facility].  Thoracic and coronary atherosclerosis by CT-very extensive CAD.  No anginal symptoms.  Type 2 diabetes-on metformin and Farxiga  Plan: superDimension CT for planning for navigational bronchoscopy. Navigational bronchoscopy and endobronchial ultrasound on Wednesday, 07/09/2022  Melrose Nakayama, MD [facility] [phone]

## 2022-07-02 ENCOUNTER — Encounter: Payer: Self-pay | Admitting: *Deleted

## 2022-07-02 ENCOUNTER — Encounter: Payer: Medicare Other | Admitting: Thoracic Surgery (Cardiothoracic Vascular Surgery)

## 2022-07-07 NOTE — Pre-Procedure Instructions (Signed)
Surgical Instructions    Your procedure is scheduled on July 09, 2022.  Report to [facility] Main Entrance "A" at 5:30 A.M., then check in with the Admitting office.  Call this number if you have problems the morning of surgery:  [phone]   If you have any questions prior to your surgery date call [phone]: Open Monday-Friday 8am-4pm    Remember:  Do not eat after midnight the night before your surgery  You may drink clear liquids until 4:30 AM the morning of your surgery.   Clear liquids allowed are: Water, Non-Citrus Juices (without pulp), Carbonated Beverages, Clear Tea, Black Coffee Only (NO MILK, CREAM OR POWDERED CREAMER of any kind), and Gatorade.     Take these medicines the morning of surgery with A SIP OF WATER:  Fluticasone-Umeclidin-Vilant (TRELEGY ELLIPTA)  predniSONE (DELTASONE)    Take these medicines the morning of surgery AS NEEDED:  albuterol (VENTOLIN HFA)   ipratropium-albuterol (DUONEB)  omeprazole (PRILOSEC)  oxyCODONE-acetaminophen (PERCOCET)    Follow your surgeon's instructions on when to stop Aspirin.  If no instructions were given by your surgeon then you will need to call the office to get those instructions.    As of today, STOP taking any Aleve, Naproxen, Ibuprofen, Motrin, Advil, Goody's, BC's, all herbal medications, fish oil, and all vitamins.   WHAT DO I DO ABOUT MY DIABETES MEDICATION?   Do not take metFORMIN (GLUCOPHAGE) the morning of surgery.  STOP taking FARXIGA three days prior to your surgery.   HOW TO MANAGE YOUR DIABETES BEFORE AND AFTER SURGERY  Why is it important to control my blood sugar before and after surgery? Improving blood sugar levels before and after surgery helps healing and can limit problems. A way of improving blood sugar control is eating a healthy diet by:  Eating less sugar and carbohydrates  Increasing activity/exercise  Talking with your doctor about reaching your blood sugar goals High blood  sugars (greater than 180 mg/dL) can raise your risk of infections and slow your recovery, so you will need to focus on controlling your diabetes during the weeks before surgery. Make sure that the doctor who takes care of your diabetes knows about your planned surgery including the date and location.  How do I manage my blood sugar before surgery? Check your blood sugar at least 4 times a day, starting 2 days before surgery, to make sure that the level is not too high or low.  Check your blood sugar the morning of your surgery when you wake up and every 2 hours until you get to the Short Stay unit.  If your blood sugar is less than 70 mg/dL, you will need to treat for low blood sugar: Do not take insulin. Treat a low blood sugar (less than 70 mg/dL) with  cup of clear juice (cranberry or apple), 4 glucose tablets, OR glucose gel. Recheck blood sugar in 15 minutes after treatment (to make sure it is greater than 70 mg/dL). If your blood sugar is not greater than 70 mg/dL on recheck, call [phone] for further instructions. Report your blood sugar to the short stay nurse when you get to Short Stay.  If you are admitted to the hospital after surgery: Your blood sugar will be checked by the staff and you will probably be given insulin after surgery (instead of oral diabetes medicines) to make sure you have good blood sugar levels. The goal for blood sugar control after surgery is 80-180 mg/dL.  Do NOT Smoke (Tobacco/Vaping) for 24 hours prior to your procedure.  If you use a CPAP at night, you may bring your mask/headgear for your overnight stay.   Contacts, glasses, piercing's, hearing aid's, dentures or partials may not be worn into surgery, please bring cases for these belongings.    For patients admitted to the hospital, discharge time will be determined by your treatment team.   Patients discharged the day of surgery will not be allowed to drive home, and someone  needs to stay with them for 24 hours.  SURGICAL WAITING ROOM VISITATION Patients having surgery or a procedure may have no more than 2 support people in the waiting area - these visitors may rotate.   Children under the age of 44 must have an adult with them who is not the patient. If the patient needs to stay at the hospital during part of their recovery, the visitor guidelines for inpatient rooms apply. Pre-op nurse will coordinate an appropriate time for 1 support person to accompany patient in pre-op.  This support person may not rotate.   Please refer to the [facility] website for the visitor guidelines for Inpatients (after your surgery is over and you are in a regular room).    Special instructions:   [place]- Preparing For Surgery  Before surgery, you can play an important role. Because skin is not sterile, your skin needs to be as free of germs as possible. You can reduce the number of germs on your skin by washing with CHG (chlorahexidine gluconate) Soap before surgery.  CHG is an antiseptic cleaner which kills germs and bonds with the skin to continue killing germs even after washing.    Oral Hygiene is also important to reduce your risk of infection.  Remember - BRUSH YOUR TEETH THE MORNING OF SURGERY WITH YOUR REGULAR TOOTHPASTE  Please do not use if you have an allergy to CHG or antibacterial soaps. If your skin becomes reddened/irritated stop using the CHG.  Do not shave (including legs and underarms) for at least 48 hours prior to first CHG shower. It is OK to shave your face.  Please follow these instructions carefully.   Shower the NIGHT BEFORE SURGERY and the MORNING OF SURGERY  If you chose to wash your hair, wash your hair first as usual with your normal shampoo.  After you shampoo, rinse your hair and body thoroughly to remove the shampoo.  Use CHG Soap as you would any other liquid soap. You can apply CHG directly to the skin and wash gently with a scrungie or  a clean washcloth.   Apply the CHG Soap to your body ONLY FROM THE NECK DOWN.  Do not use on open wounds or open sores. Avoid contact with your eyes, ears, mouth and genitals (private parts). Wash Face and genitals (private parts)  with your normal soap.   Wash thoroughly, paying special attention to the area where your surgery will be performed.  Thoroughly rinse your body with warm water from the neck down.  DO NOT shower/wash with your normal soap after using and rinsing off the CHG Soap.  Pat yourself dry with a CLEAN TOWEL.  Wear CLEAN PAJAMAS to bed the night before surgery  Place CLEAN SHEETS on your bed the night before your surgery  DO NOT SLEEP WITH PETS.   Day of Surgery: Take a shower with CHG soap. Do not wear jewelry or makeup Do not wear lotions, powders, colognes, or deodorant. Do not shave 48  hours prior to surgery.  Men may shave face and neck. Do not bring valuables to the hospital.  [facility] is not responsible for any belongings or valuables. Do not wear nail polish, gel polish, artificial nails, or any other type of covering on natural nails (fingers and toes) If you have artificial nails or gel coating that need to be removed by a nail salon, please have this removed prior to surgery. Artificial nails or gel coating may interfere with anesthesia's ability to adequately monitor your vital signs.  Wear Clean/Comfortable clothing the morning of surgery Remember to brush your teeth WITH YOUR REGULAR TOOTHPASTE.   Please read over the following fact sheets that you were given.    If you received a COVID test during your pre-op visit  it is requested that you wear a mask when out in public, stay away from anyone that may not be feeling well and notify your surgeon if you develop symptoms. If you have been in contact with anyone that has tested positive in the last 10 days please notify you surgeon.

## 2022-07-08 ENCOUNTER — Ambulatory Visit (HOSPITAL_COMMUNITY)
Admission: RE | Admit: 2022-07-08 | Discharge: 2022-07-08 | Disposition: A | Discharge status: Home / Self Care | Payer: Medicare Other | Source: Ambulatory Visit | Attending: Thoracic Surgery (Cardiothoracic Vascular Surgery) | Admitting: Thoracic Surgery (Cardiothoracic Vascular Surgery)

## 2022-07-08 ENCOUNTER — Encounter (HOSPITAL_COMMUNITY): Payer: Self-pay

## 2022-07-08 ENCOUNTER — Encounter (HOSPITAL_COMMUNITY)
Admission: RE | Admit: 2022-07-08 | Discharge: 2022-07-08 | Disposition: A | Discharge status: Home / Self Care | Payer: Medicare Other | Source: Ambulatory Visit | Attending: Thoracic Surgery (Cardiothoracic Vascular Surgery) | Admitting: Thoracic Surgery (Cardiothoracic Vascular Surgery)

## 2022-07-08 ENCOUNTER — Other Ambulatory Visit: Payer: Self-pay

## 2022-07-08 VITALS — BP 91/61 | HR 118 | Temp 97.9°F | Resp 18 | Ht 75.0 in | Wt 222.8 lb

## 2022-07-08 DIAGNOSIS — Z20822 Contact with and (suspected) exposure to covid-19: Secondary | ICD-10-CM | POA: Insufficient documentation

## 2022-07-08 DIAGNOSIS — E119 Type 2 diabetes mellitus without complications: Secondary | ICD-10-CM | POA: Diagnosis not present

## 2022-07-08 DIAGNOSIS — J439 Emphysema, unspecified: Secondary | ICD-10-CM | POA: Insufficient documentation

## 2022-07-08 DIAGNOSIS — R911 Solitary pulmonary nodule: Secondary | ICD-10-CM | POA: Insufficient documentation

## 2022-07-08 DIAGNOSIS — I2510 Atherosclerotic heart disease of native coronary artery without angina pectoris: Secondary | ICD-10-CM | POA: Diagnosis not present

## 2022-07-08 DIAGNOSIS — I700 Atherosclerosis of aorta: Secondary | ICD-10-CM | POA: Insufficient documentation

## 2022-07-08 DIAGNOSIS — R590 Localized enlarged lymph nodes: Secondary | ICD-10-CM

## 2022-07-08 DIAGNOSIS — Z01818 Encounter for other preprocedural examination: Secondary | ICD-10-CM | POA: Insufficient documentation

## 2022-07-08 HISTORY — DX: Personal history of other diseases of the digestive system: Z87.19

## 2022-07-08 HISTORY — DX: Unspecified osteoarthritis, unspecified site: M19.90

## 2022-07-08 LAB — CBC
HCT: 51 % (ref 39.0–52.0)
Hemoglobin: 16.5 g/dL (ref 13.0–17.0)
MCH: 29 pg (ref 26.0–34.0)
MCHC: 32.4 g/dL (ref 30.0–36.0)
MCV: 89.6 fL (ref 80.0–100.0)
Platelets: 215 10*3/uL (ref 150–400)
RBC: 5.69 MIL/uL (ref 4.22–5.81)
RDW: 14.5 % (ref 11.5–15.5)
WBC: 9.3 10*3/uL (ref 4.0–10.5)
nRBC: 0 % (ref 0.0–0.2)

## 2022-07-08 LAB — COMPREHENSIVE METABOLIC PANEL
ALT: 20 U/L (ref 0–44)
AST: 13 U/L — ABNORMAL LOW (ref 15–41)
Albumin: 3.5 g/dL (ref 3.5–5.0)
Alkaline Phosphatase: 54 U/L (ref 38–126)
Anion gap: 12 (ref 5–15)
BUN: 29 mg/dL — ABNORMAL HIGH (ref 8–23)
CO2: 23 mmol/L (ref 22–32)
Calcium: 8.8 mg/dL — ABNORMAL LOW (ref 8.9–10.3)
Chloride: 101 mmol/L (ref 98–111)
Creatinine, Ser: 1.54 mg/dL — ABNORMAL HIGH (ref 0.61–1.24)
GFR, Estimated: 48 mL/min — ABNORMAL LOW (ref >60)
Glucose, Bld: 227 mg/dL — ABNORMAL HIGH (ref 70–99)
Potassium: 4.2 mmol/L (ref 3.5–5.1)
Sodium: 136 mmol/L (ref 135–145)
Total Bilirubin: 0.4 mg/dL (ref 0.3–1.2)
Total Protein: 6.7 g/dL (ref 6.5–8.1)

## 2022-07-08 LAB — HEMOGLOBIN A1C
Hgb A1c MFr Bld: 8 % — ABNORMAL HIGH (ref 4.8–5.6)
Mean Plasma Glucose: 182.9 mg/dL

## 2022-07-08 LAB — APTT: aPTT: 29 seconds (ref 24–36)

## 2022-07-08 LAB — GLUCOSE, CAPILLARY: Glucose-Capillary: 289 mg/dL — ABNORMAL HIGH (ref 70–99)

## 2022-07-08 LAB — PROTIME-INR
INR: 0.9 (ref 0.8–1.2)
Prothrombin Time: 12.5 seconds (ref 11.4–15.2)

## 2022-07-08 NOTE — Anesthesia Preprocedure Evaluation (Signed)
Anesthesia Evaluation  Patient identified by MRN, date of birth, ID band Patient awake    Reviewed: Allergy & Precautions, NPO status , Patient's Chart, lab work & pertinent test results  Airway Mallampati: III  TM Distance: >3 FB Neck ROM: Full    Dental  (+) Edentulous Upper, Edentulous Lower   Pulmonary pneumonia, COPD, Current Smoker and Patient abstained from smoking.,     + wheezing      Cardiovascular hypertension, Pt. on medications + CAD   Rhythm:Regular Rate:Normal     Neuro/Psych negative neurological ROS     GI/Hepatic Neg liver ROS, hiatal hernia,   Endo/Other  diabetes  Renal/GU negative Renal ROS     Musculoskeletal  (+) Arthritis ,   Abdominal   Peds  Hematology negative hematology ROS (+)   Anesthesia Other Findings   Reproductive/Obstetrics                            Anesthesia Physical Anesthesia Plan  ASA: 3  Anesthesia Plan: General   Post-op Pain Management: Tylenol PO (pre-op)* and Minimal or no pain anticipated   Induction: Intravenous  PONV Risk Score and Plan: 1 and Ondansetron, Dexamethasone and Treatment may vary due to age or medical condition  Airway Management Planned: Oral ETT  Additional Equipment:   Intra-op Plan:   Post-operative Plan: Extubation in OR  Informed Consent: I have reviewed the patients History and Physical, chart, labs and discussed the procedure including the risks, benefits and alternatives for the proposed anesthesia with the patient or authorized representative who has indicated his/her understanding and acceptance.     Dental advisory given  Plan Discussed with: CRNA  Anesthesia Plan Comments:        Anesthesia Quick Evaluation

## 2022-07-08 NOTE — Progress Notes (Addendum)
PCP - Gordy Clement, PA-C Cardiologist - Denies  PPM/ICD - Denies Device Orders - n/a Rep Notified - n/a  Chest x-ray - 07/08/2022 EKG - 07/08/2022 Stress Test - Per patient, in 2013. ECHO - 04/21/2022 Cardiac Cath - Per patient, had one in the 1990s but it was clear and any issues were related to a hiatal hernia they found.  Sleep Study - Per patient, had one 5+ years ago and it was negative CPAP - n/a  Pt is DM2. He does have a meter at home but it does not work. He has not gotten a replacement for it. He takes his [place] and Metformin every day. If he feels like his blood sugar is low he drinks a soda. CBG at PAT visit was 289. He has had a gravy biscuit, two eggs, sardines, and crackers today for meals. Advised patient that he needs to try to eat low carb/sugar to keep his blood sugar in normal range. He was also advised to drink water today to help with his blood sugar.  Blood Thinner Instructions: n/a Aspirin Instructions: Pt has already stopped taking his asa  ERAS Protcol - Yes. Clear liquids until 0430 morning of surgery PRE-SURGERY Ensure or G2- None ordered  COVID TEST- Yes. Result pending   Anesthesia review: No.  Patient denies shortness of breath, fever, cough and chest pain at PAT appointment   All instructions explained to the patient, with a verbal understanding of the material. Patient agrees to go over the instructions while at home for a better understanding. Patient also instructed to self quarantine after being tested for COVID-19. The opportunity to ask questions was provided.

## 2022-07-09 ENCOUNTER — Encounter (HOSPITAL_COMMUNITY): Payer: Self-pay | Admitting: Thoracic Surgery (Cardiothoracic Vascular Surgery)

## 2022-07-09 ENCOUNTER — Ambulatory Visit (HOSPITAL_COMMUNITY)
Admission: RE | Admit: 2022-07-09 | Discharge: 2022-07-09 | Disposition: A | Discharge status: Home / Self Care | Payer: Medicare Other | Attending: Thoracic Surgery (Cardiothoracic Vascular Surgery) | Admitting: Thoracic Surgery (Cardiothoracic Vascular Surgery)

## 2022-07-09 ENCOUNTER — Ambulatory Visit (HOSPITAL_BASED_OUTPATIENT_CLINIC_OR_DEPARTMENT_OTHER): Payer: Medicare Other | Admitting: Anesthesiology

## 2022-07-09 ENCOUNTER — Other Ambulatory Visit: Payer: Self-pay

## 2022-07-09 ENCOUNTER — Ambulatory Visit (HOSPITAL_COMMUNITY): Payer: Medicare Other

## 2022-07-09 ENCOUNTER — Ambulatory Visit (HOSPITAL_COMMUNITY): Payer: Medicare Other | Admitting: Physician Assistant

## 2022-07-09 ENCOUNTER — Encounter (HOSPITAL_COMMUNITY)
Admission: RE | Disposition: A | Discharge status: Home / Self Care | Payer: Self-pay | Source: Home / Self Care | Attending: Thoracic Surgery (Cardiothoracic Vascular Surgery)

## 2022-07-09 DIAGNOSIS — I2510 Atherosclerotic heart disease of native coronary artery without angina pectoris: Secondary | ICD-10-CM | POA: Insufficient documentation

## 2022-07-09 DIAGNOSIS — I700 Atherosclerosis of aorta: Secondary | ICD-10-CM | POA: Insufficient documentation

## 2022-07-09 DIAGNOSIS — J449 Chronic obstructive pulmonary disease, unspecified: Secondary | ICD-10-CM | POA: Diagnosis not present

## 2022-07-09 DIAGNOSIS — I10 Essential (primary) hypertension: Secondary | ICD-10-CM | POA: Insufficient documentation

## 2022-07-09 DIAGNOSIS — C3412 Malignant neoplasm of upper lobe, left bronchus or lung: Secondary | ICD-10-CM | POA: Diagnosis not present

## 2022-07-09 DIAGNOSIS — E119 Type 2 diabetes mellitus without complications: Secondary | ICD-10-CM | POA: Insufficient documentation

## 2022-07-09 DIAGNOSIS — R911 Solitary pulmonary nodule: Secondary | ICD-10-CM

## 2022-07-09 DIAGNOSIS — R590 Localized enlarged lymph nodes: Secondary | ICD-10-CM

## 2022-07-09 DIAGNOSIS — F17210 Nicotine dependence, cigarettes, uncomplicated: Secondary | ICD-10-CM | POA: Diagnosis not present

## 2022-07-09 DIAGNOSIS — F17200 Nicotine dependence, unspecified, uncomplicated: Secondary | ICD-10-CM

## 2022-07-09 HISTORY — PX: VIDEO BRONCHOSCOPY WITH ENDOBRONCHIAL ULTRASOUND: SHX6177

## 2022-07-09 HISTORY — PX: VIDEO BRONCHOSCOPY WITH ENDOBRONCHIAL NAVIGATION: SHX6175

## 2022-07-09 LAB — GLUCOSE, CAPILLARY
Glucose-Capillary: 158 mg/dL — ABNORMAL HIGH (ref 70–99)
Glucose-Capillary: 157 mg/dL — ABNORMAL HIGH (ref 70–99)

## 2022-07-09 LAB — SARS CORONAVIRUS 2 (TAT 6-24 HRS): SARS Coronavirus 2: NEGATIVE

## 2022-07-09 SURGERY — VIDEO BRONCHOSCOPY WITH ENDOBRONCHIAL NAVIGATION
Anesthesia: General | Site: Chest

## 2022-07-09 MED ORDER — LIDOCAINE 2% (20 MG/ML) 5 ML SYRINGE
INTRAMUSCULAR | Status: DC | PRN
  Administered 2022-07-09: 100 mg via INTRAVENOUS

## 2022-07-09 MED ORDER — FENTANYL CITRATE (PF) 250 MCG/5ML IJ SOLN
INTRAMUSCULAR | Status: AC
  Filled 2022-07-09: qty 5

## 2022-07-09 MED ORDER — ONDANSETRON HCL 4 MG/2ML IJ SOLN
INTRAMUSCULAR | Status: DC | PRN
  Administered 2022-07-09: 4 mg via INTRAVENOUS

## 2022-07-09 MED ORDER — EPINEPHRINE PF 1 MG/ML IJ SOLN
INTRAMUSCULAR | Status: AC
  Filled 2022-07-09: qty 1

## 2022-07-09 MED ORDER — INSULIN ASPART 100 UNIT/ML IJ SOLN: 0.0000 [IU] | INTRAMUSCULAR | Status: DC | PRN

## 2022-07-09 MED ORDER — MIDAZOLAM HCL 2 MG/2ML IJ SOLN
INTRAMUSCULAR | Status: AC
Start: 2022-07-09 — End: ?
  Filled 2022-07-09: qty 2

## 2022-07-09 MED ORDER — PROMETHAZINE HCL 25 MG/ML IJ SOLN: 6.2500 mg | INTRAMUSCULAR | Status: DC | PRN

## 2022-07-09 MED ORDER — PHENYLEPHRINE 80 MCG/ML (10ML) SYRINGE FOR IV PUSH (FOR BLOOD PRESSURE SUPPORT)
PREFILLED_SYRINGE | INTRAVENOUS | Status: AC
Start: 2022-07-09 — End: ?
  Filled 2022-07-09: qty 10

## 2022-07-09 MED ORDER — CHLORHEXIDINE GLUCONATE 0.12 % MT SOLN
15.0000 mL | Freq: Once | OROMUCOSAL | Status: AC
  Administered 2022-07-09: 15 mL via OROMUCOSAL
  Filled 2022-07-09: qty 15

## 2022-07-09 MED ORDER — DEXAMETHASONE SODIUM PHOSPHATE 10 MG/ML IJ SOLN
INTRAMUSCULAR | Status: DC | PRN
  Administered 2022-07-09: 5 mg via INTRAVENOUS

## 2022-07-09 MED ORDER — ACETAMINOPHEN 500 MG PO TABS
1000.0000 mg | ORAL_TABLET | Freq: Once | ORAL | Status: AC
Start: 2022-07-09 — End: 2022-07-09
  Administered 2022-07-09: 1000 mg via ORAL
  Filled 2022-07-09: qty 2

## 2022-07-09 MED ORDER — PROPOFOL 10 MG/ML IV BOLUS
INTRAVENOUS | Status: AC
  Filled 2022-07-09: qty 20

## 2022-07-09 MED ORDER — FENTANYL CITRATE (PF) 250 MCG/5ML IJ SOLN
INTRAMUSCULAR | Status: DC | PRN
  Administered 2022-07-09 (×2): 50 ug via INTRAVENOUS

## 2022-07-09 MED ORDER — PHENYLEPHRINE 80 MCG/ML (10ML) SYRINGE FOR IV PUSH (FOR BLOOD PRESSURE SUPPORT)
PREFILLED_SYRINGE | INTRAVENOUS | Status: DC | PRN
  Administered 2022-07-09: 80 ug via INTRAVENOUS
  Administered 2022-07-09: 160 ug via INTRAVENOUS
  Administered 2022-07-09: 80 ug via INTRAVENOUS

## 2022-07-09 MED ORDER — ROCURONIUM BROMIDE 10 MG/ML (PF) SYRINGE
PREFILLED_SYRINGE | INTRAVENOUS | Status: AC
  Filled 2022-07-09: qty 10

## 2022-07-09 MED ORDER — FENTANYL CITRATE (PF) 100 MCG/2ML IJ SOLN: 25.0000 ug | INTRAMUSCULAR | Status: DC | PRN

## 2022-07-09 MED ORDER — ROCURONIUM BROMIDE 10 MG/ML (PF) SYRINGE
PREFILLED_SYRINGE | INTRAVENOUS | Status: DC | PRN
  Administered 2022-07-09 (×2): 20 mg via INTRAVENOUS
  Administered 2022-07-09: 60 mg via INTRAVENOUS

## 2022-07-09 MED ORDER — 0.9 % SODIUM CHLORIDE (POUR BTL) OPTIME
TOPICAL | Status: DC | PRN
  Administered 2022-07-09: 1000 mL

## 2022-07-09 MED ORDER — PROPOFOL 500 MG/50ML IV EMUL
INTRAVENOUS | Status: DC | PRN
  Administered 2022-07-09: 150 ug/kg/min via INTRAVENOUS

## 2022-07-09 MED ORDER — PROPOFOL 10 MG/ML IV BOLUS
INTRAVENOUS | Status: DC | PRN
  Administered 2022-07-09: 150 mg via INTRAVENOUS

## 2022-07-09 MED ORDER — PHENYLEPHRINE HCL-NACL 20-0.9 MG/250ML-% IV SOLN
INTRAVENOUS | Status: DC | PRN
  Administered 2022-07-09: 35 ug/min via INTRAVENOUS

## 2022-07-09 MED ORDER — SUGAMMADEX SODIUM 200 MG/2ML IV SOLN
INTRAVENOUS | Status: DC | PRN
  Administered 2022-07-09: 200 mg via INTRAVENOUS

## 2022-07-09 MED ORDER — EPINEPHRINE PF 1 MG/ML IJ SOLN
INTRAMUSCULAR | Status: DC | PRN
  Administered 2022-07-09: 1 mg

## 2022-07-09 MED ORDER — ORAL CARE MOUTH RINSE: 15.0000 mL | Freq: Once | OROMUCOSAL | Status: AC

## 2022-07-09 MED ORDER — DEXAMETHASONE SODIUM PHOSPHATE 10 MG/ML IJ SOLN
INTRAMUSCULAR | Status: AC
Start: 2022-07-09 — End: ?
  Filled 2022-07-09: qty 1

## 2022-07-09 MED ORDER — ONDANSETRON HCL 4 MG/2ML IJ SOLN
INTRAMUSCULAR | Status: AC
  Filled 2022-07-09: qty 2

## 2022-07-09 MED ORDER — LIDOCAINE 2% (20 MG/ML) 5 ML SYRINGE
INTRAMUSCULAR | Status: AC
  Filled 2022-07-09: qty 5

## 2022-07-09 MED ORDER — LACTATED RINGERS IV SOLN: INTRAVENOUS | Status: DC

## 2022-07-09 SURGICAL SUPPLY — 34 items
ADAPTER BRONCHOSCOPE OLYMPUS (ADAPTER) ×2 IMPLANT
ADAPTER VALVE BIOPSY EBUS (MISCELLANEOUS) IMPLANT
ADPR BSCP OLMPS EDG (ADAPTER) ×1
ADPTR VALVE BIOPSY EBUS (MISCELLANEOUS) ×1
BRUSH SUPERTRAX BIOPSY (INSTRUMENTS) IMPLANT
BRUSH SUPERTRAX NDL-TIP CYTO (INSTRUMENTS) ×2 IMPLANT
CANISTER SUCT 3000ML PPV (MISCELLANEOUS) ×4 IMPLANT
CNTNR URN SCR LID CUP LEK RST (MISCELLANEOUS) ×6 IMPLANT
CONT SPEC 4OZ STRL OR WHT (MISCELLANEOUS) ×3
COVER BACK TABLE 60X90IN (DRAPES) ×4 IMPLANT
FILTER STRAW FLUID ASPIR (MISCELLANEOUS) ×2 IMPLANT
FORCEPS BIOP SUPERTRX PREMAR (INSTRUMENTS) IMPLANT
GAUZE SPONGE 4X4 12PLY STRL (GAUZE/BANDAGES/DRESSINGS) ×2 IMPLANT
GLOVE SS BIOGEL STRL SZ 7.5 (GLOVE) ×2 IMPLANT
GLOVE SUPERSENSE BIOGEL SZ 7.5 (GLOVE) ×1
GLOVE SURG SIGNA 7.5 PF LTX (GLOVE) ×2 IMPLANT
GOWN STRL REUS W/ TWL XL LVL3 (GOWN DISPOSABLE) ×4 IMPLANT
GOWN STRL REUS W/TWL XL LVL3 (GOWN DISPOSABLE) ×3
KIT CLEAN ENDO COMPLIANCE (KITS) ×6 IMPLANT
KIT ILLUMISITE 180 PROCEDURE (KITS) IMPLANT
KIT TURNOVER KIT B (KITS) ×4 IMPLANT
MARKER SKIN DUAL TIP RULER LAB (MISCELLANEOUS) ×4 IMPLANT
NDL SUPERTRX PREMARK BIOPSY (NEEDLE) IMPLANT
NEEDLE SUPERTRX PREMARK BIOPSY (NEEDLE) ×1 IMPLANT
NS IRRIG 1000ML POUR BTL (IV SOLUTION) ×4 IMPLANT
OIL SILICONE PENTAX (PARTS (SERVICE/REPAIRS)) ×4 IMPLANT
PAD ARMBOARD 7.5X6 YLW CONV (MISCELLANEOUS) ×8 IMPLANT
PATCHES PATIENT (LABEL) ×6 IMPLANT
SYR 20ML ECCENTRIC (SYRINGE) ×6 IMPLANT
SYR 20ML LL LF (SYRINGE) ×6 IMPLANT
TOWEL GREEN STERILE (TOWEL DISPOSABLE) ×4 IMPLANT
TRAP SPECIMEN MUCUS 40CC (MISCELLANEOUS) ×4 IMPLANT
TUBE CONNECTING 20X1/4 (TUBING) ×6 IMPLANT
UNDERPAD 30X36 HEAVY ABSORB (UNDERPADS AND DIAPERS) ×2 IMPLANT

## 2022-07-09 NOTE — Anesthesia Procedure Notes (Signed)
Procedure Name: Intubation Date/Time: 07/09/2022 7:50 AM  Performed by: Inda Coke, CRNAPre-anesthesia Checklist: Patient identified, Emergency Drugs available, Suction available, Timeout performed and Patient being monitored Patient Re-evaluated:Patient Re-evaluated prior to induction Oxygen Delivery Method: Circle system utilized Preoxygenation: Pre-oxygenation with 100% oxygen Induction Type: IV induction Ventilation: Mask ventilation without difficulty and Oral airway inserted - appropriate to patient size Laryngoscope Size: Mac and 4 Grade View: Grade I Tube type: Oral Tube size: 8.5 mm Number of attempts: 1 Airway Equipment and Method: Stylet Placement Confirmation: ETT inserted through vocal cords under direct vision, positive ETCO2, CO2 detector and breath sounds checked- equal and bilateral Secured at: 24 cm Tube secured with: Tape Dental Injury: Teeth and Oropharynx as per pre-operative assessment

## 2022-07-09 NOTE — Transfer of Care (Signed)
Immediate Anesthesia Transfer of Care Note  Patient: Allen [name]  Procedure(s) Performed: VIDEO BRONCHOSCOPY WITH ENDOBRONCHIAL NAVIGATION (Chest) VIDEO BRONCHOSCOPY WITH ENDOBRONCHIAL ULTRASOUND (Chest)  Patient Location: PACU  Anesthesia Type:General  Level of Consciousness: awake, alert  and oriented  Airway & Oxygen Therapy: Patient Spontanous Breathing and Patient connected to face mask oxygen  Post-op Assessment: Report given to RN and Post -op Vital signs reviewed and stable  Post vital signs: Reviewed and stable  Last Vitals:  Vitals Value Taken Time  BP 106/59 07/09/22 1015  Temp    Pulse 95 07/09/22 1016  Resp 19 07/09/22 1016  SpO2 94 % 07/09/22 1016  Vitals shown include unvalidated device data.  Last Pain:  Vitals:   07/09/22 1040  TempSrc:   PainSc: 0-No pain         Complications: No notable events documented.

## 2022-07-09 NOTE — Brief Op Note (Signed)
07/09/2022  10:18 AM  PATIENT:  Allen [name]  71 y.o. 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,N2)  PROCEDURE:  Procedure(s): VIDEO BRONCHOSCOPY WITH ENDOBRONCHIAL NAVIGATION (N/A) VIDEO BRONCHOSCOPY WITH ENDOBRONCHIAL ULTRASOUND (N/A)  SURGEON:  Surgeon(s) and Role:    * Melrose Nakayama, MD - Primary  PHYSICIAN ASSISTANT:   ASSISTANTS: Nicanor Alcon, MD   ANESTHESIA:   general  EBL:  0 mL   BLOOD ADMINISTERED:none  DRAINS: none   LOCAL MEDICATIONS USED:  NONE  SPECIMEN:  Source of Specimen:  left upper lobe nodule  DISPOSITION OF SPECIMEN:  PATHOLOGY  COUNTS:  NO endoscopic  TOURNIQUET:  * No tourniquets in log *  DICTATION: .Other Dictation: Dictation Number -  PLAN OF CARE: Discharge to home after PACU  PATIENT DISPOSITION:  PACU - hemodynamically stable.   Delay start of Pharmacological VTE agent (>24hrs) due to surgical blood loss or risk of bleeding: not applicable

## 2022-07-09 NOTE — Anesthesia Postprocedure Evaluation (Signed)
Anesthesia Post Note  Patient: Allen [name]  Procedure(s) Performed: VIDEO BRONCHOSCOPY WITH ENDOBRONCHIAL NAVIGATION (Chest) VIDEO BRONCHOSCOPY WITH ENDOBRONCHIAL ULTRASOUND (Chest)      Patient location during evaluation: PACU Anesthesia Type: General Level of consciousness: sedated and patient cooperative Pain management: pain level controlled Vital Signs Assessment: post-procedure vital signs reviewed and stable Respiratory status: spontaneous breathing Cardiovascular status: stable Anesthetic complications: no   No notable events documented.  Last Vitals:  Vitals:   07/09/22 1030 07/09/22 1045  BP: 96/62 (!) 106/59  Pulse: 98 97  Resp: 19 18  Temp:  36.4 C  SpO2: 93% 91%    Last Pain:  Vitals:   07/09/22 1045  TempSrc:   PainSc: 0-No pain                 Nolon Nations

## 2022-07-09 NOTE — Interval H&P Note (Signed)
History and Physical Interval Note:  07/09/2022 7:28 AM  Allen [name]  has presented today for surgery, with the diagnosis of LUL NODULES MEDIASTINAL ADENOPATHY.  The various methods of treatment have been discussed with the patient and family. After consideration of risks, benefits and other options for treatment, the patient has consented to  Procedure(s): VIDEO BRONCHOSCOPY WITH ENDOBRONCHIAL NAVIGATION (N/A) VIDEO BRONCHOSCOPY WITH ENDOBRONCHIAL ULTRASOUND (N/A) as a surgical intervention.  The patient's history has been reviewed, patient examined, no change in status, stable for surgery.  I have reviewed the patient's chart and labs.  Questions were answered to the patient's satisfaction.     Melrose Nakayama

## 2022-07-09 NOTE — Op Note (Signed)
NAMELATHYN, GRIGGS MEDICAL RECORD NO: [id number] ACCOUNT NO: [id number] DATE OF BIRTH: 1951-04-05 FACILITY: [facility] LOCATION: [facility] PHYSICIAN: Revonda Standard. Roxan Hockey, MD  Operative Report   DATE OF PROCEDURE: 07/09/2022  PREOPERATIVE DIAGNOSIS:  Left upper lobe lung nodule with mediastinal adenopathy.  POSTOPERATIVE DIAGNOSIS:  Non-small cell carcinoma, clinical stage IIIA (T1, N2).  PROCEDURE:  Electromagnetic navigational bronchoscopy with needle aspirations, brushings, transbronchial biopsies and bronchoalveolar lavage and endobronchial ultrasound.  SURGEON:  Revonda Standard. Roxan Hockey, MD  ASSISTANT:  Nicanor Alcon, MD  ANESTHESIA:  General.  FINDINGS:  Needle aspirations and brushings showed blood.  Touch prep of biopsy showed adequate specimen for diagnosis.  Unable to obtain good windows for biopsy of level 4L lymph node.  CLINICAL NOTE: Mr. Mojica is a 71 year old man with a history of tobacco abuse, who was found to have a left upper lobe lung nodule on a low-dose CT scan for lung cancer screening in June of this year.  On PET/CT, the nodule was hypermetabolic.  There  also was a hypermetabolic left paratracheal node.  He was advised to undergo navigational bronchoscopy and endobronchial ultrasound for diagnostic and staging purposes.  The indications, risks, benefits, and alternatives were discussed in detail with the  patient.  He was not a candidate for surgical resection.  He accepted the risks and agreed to proceed.  DESCRIPTION OF PROCEDURE: Mr. Roblero was brought to the operating room on 07/09/2022.  He had induction of general anesthesia and was intubated.  A Bair Hugger was placed for active warming and sequential compression devices were placed on the calves  for DVT prophylaxis.  Planning for the navigational bronchoscopy portion of the procedure was done on the console prior to induction.  A timeout was performed.  Flexible fiberoptic bronchoscopy was performed via  the endotracheal tube, it revealed extensive thick clear secretions.  There was normal endobronchial anatomy with no endobronchial lesions to the level of subsegmental bronchi.  The endobronchial ultrasound probe was advanced.  Windows on the 4L node were difficult.  No node of any size was seen in the subcarinal area.  After multiple attempts to obtain an adequate window, it was not felt safe to attempt to biopsy the 4L node  and the endobronchial ultrasound probe was removed.  The bronchoscope was reinserted.  The locatable guide for navigation was placed.  Registration was performed.  There was good correlation of the video and virtual bronchoscopy.  The bronchoscope was directed to the left upper lobe bronchus and the  appropriate subsegmental bronchus was cannulated.  With manipulation, the catheter was advanced to within 1.4 cm of the tumor with good alignment.  Locatable guide was removed.  Local registration was performed and then the catheter was repositioned into  good alignment with the nodule that was demonstrated by local registration.  It again was within 1.4 cm and with good alignment.  Locatable guide was removed.  The aspirating needle was advanced. All sampling was done with fluoroscopy.  The nodule was  visible on fluoroscopy and the samples did appear to be coming from the nodule.  Three needle aspirations were performed followed by 3 passes with a needle brush. While those specimens were being evaluated, multiple biopsies were obtained.  The initial  needle aspirations and brushings showed primarily blood.  There were some atypical cells, but not definitive for diagnosis.  Two additional needle aspirations and 2 additional passes with a needle brush were performed and again these were not definitive.   An additional biopsy was  performed and used for a touch prep, which showed an adequate specimen for diagnosis.  While that was being evaluated, bronchoalveolar lavage was performed  injecting 100 mL of saline with withdrawal of approximately 15 mL of bloody  fluid.  This was sent along with the bronchial washings for cytology.  Final inspection was made with the bronchoscope.  There was no ongoing bleeding.  A final inspection was made with fluoroscopy and there was no evidence of pneumothorax.  The  bronchoscope was withdrawn.  The patient then was extubated in the operating room and taken to the postanesthetic care unit in good condition.  The total fluoroscopy time was 5 minutes and 21 seconds and the total dose was 73.1 milligray.   NIK D: 07/09/2022 4:50:06 pm T: 07/09/2022 10:52:00 pm  JOB: [id number]/ [id number]

## 2022-07-09 NOTE — Discharge Instructions (Signed)
Do not drive or engage in heavy physical activity for 24 hours  You may resume normal activities tomorrow  You will likely cough up small amounts of blood over the next few days.   You may use acetaminophen (Tylenol) if needed for discomfort.  You may use an over the counter cough medication or throat lozenge if needed.  Call [phone] if you develop chest pain, shortness of breath, fever  101 F or cough up more than 2 tablespoons of blood.  My office will contact you with follow up information.

## 2022-07-10 ENCOUNTER — Encounter (HOSPITAL_COMMUNITY): Payer: Self-pay | Admitting: Thoracic Surgery (Cardiothoracic Vascular Surgery)

## 2022-07-10 LAB — ACID FAST SMEAR (AFB, MYCOBACTERIA): Acid Fast Smear: NEGATIVE

## 2022-07-11 LAB — CYTOLOGY - NON PAP

## 2022-07-11 LAB — SURGICAL PATHOLOGY

## 2022-07-12 LAB — AEROBIC/ANAEROBIC CULTURE W GRAM STAIN (SURGICAL/DEEP WOUND)

## 2022-07-18 ENCOUNTER — Other Ambulatory Visit: Payer: Self-pay | Admitting: *Deleted

## 2022-07-18 NOTE — Progress Notes (Signed)
The proposed treatment discussed in conference is for discussion purpose only and is not a binding recommendation.  The patients have not been physically examined, or presented with their treatment options.  Therefore, final treatment plans cannot be decided.  

## 2022-07-22 NOTE — Progress Notes (Signed)
Lung mass, requiring biopsy

## 2022-07-28 NOTE — Progress Notes (Unsigned)
[facility]  [address] [place],  [place]  [address] [phone]  Clinic Day:  07/29/2022  Referring physician: [facility]   HISTORY OF PRESENT ILLNESS:  The patient is a 71 y.o. male who I was asked to consult upon for newly diagnosed lung cancer.  This gentleman's history dates back over these past few months as he has had recurrent pneumonias.  Based upon this, his primary care office ordered a chest CT, which showed a small lesion in his left upper lobe.  This was further confirmed by a PET scan done in early July 2023, which showed this lesion, as well as a hilar and mediastinal lymph node, to be hypermetabolic.  The patient eventually ended up undergoing a bronchoscopy with biopsy in late August 2023, whose pathology came back consistent with adenocarcinoma.  He comes in today to go over his biopsy and scan results, as well as their implications.  The patient admits to having a productive cough and intermittent shortness of breath.  He denies having any hemoptysis.  He also denies having any weight loss over this calendar year.  Of note, he has smoked as much as 2 packs of cigarettes daily for 61 years.  PAST MEDICAL HISTORY:   Past Medical History:  Diagnosis Date  . Arthritis    left knee and right shoulder  . COPD (chronic obstructive pulmonary disease) ([facility])   . Coronary atherosclerosis   . Diabetes mellitus without complication ([facility])   . GERD (gastroesophageal reflux disease)   . History of hiatal hernia   . Hypertension   . Lung cancer ([facility])   . Pneumonia 06/2022  . Thoracic aortic atherosclerosis ([facility])     PAST SURGICAL HISTORY:   Past Surgical History:  Procedure Laterality Date  . APPENDECTOMY  1963  . CARDIAC CATHETERIZATION     Per pt, had one in the 1990s  . COLONOSCOPY  2021  . ELBOW SURGERY Left   . LUNG BIOPSY    . PROSTATE SURGERY  2019   Urolift implanted  . VIDEO BRONCHOSCOPY WITH ENDOBRONCHIAL  NAVIGATION N/A 07/09/2022   Procedure: VIDEO BRONCHOSCOPY WITH ENDOBRONCHIAL NAVIGATION;  Surgeon: Melrose Nakayama, MD;  Location: [facility];  Service: Thoracic;  Laterality: N/A;  . VIDEO BRONCHOSCOPY WITH ENDOBRONCHIAL ULTRASOUND N/A 07/09/2022   Procedure: VIDEO BRONCHOSCOPY WITH ENDOBRONCHIAL ULTRASOUND;  Surgeon: Melrose Nakayama, MD;  Location: [facility];  Service: Thoracic;  Laterality: N/A;    CURRENT MEDICATIONS:   Current Outpatient Medications  Medication Sig Dispense Refill  . ACCU-CHEK GUIDE test strip USE TO CHECK BLOOD SUGAR TWICE DAILY BEFORE MEALS    . albuterol (VENTOLIN HFA) 108 (90 Base) MCG/ACT inhaler Inhale 2 puffs into the lungs every 4 (four) hours as needed for wheezing or shortness of breath. 18 g 0  . aspirin EC 81 MG tablet Take 81 mg by mouth daily. Swallow whole.    Marland Kitchen FARXIGA 10 MG TABS tablet Take 10 mg by mouth daily.    . Fluticasone-Umeclidin-Vilant (TRELEGY ELLIPTA) 100-62.5-25 MCG/INH AEPB Inhale 1 puff into the lungs daily. 1 each 0  . furosemide (LASIX) 20 MG tablet Take 20 mg by mouth daily as needed (leg swelling).    . hydrochlorothiazide (HYDRODIURIL) 25 MG tablet Take 25 mg by mouth daily.    Marland Kitchen ipratropium-albuterol (DUONEB) 0.5-2.5 (3) MG/3ML SOLN Take 3 mLs by nebulization every 6 (six) hours as needed (Asthma).    . lisinopril (ZESTRIL) 20 MG tablet Take 20 mg  by mouth daily.    . metFORMIN (GLUCOPHAGE) 500 MG tablet Take 500 mg by mouth daily.    Marland Kitchen omeprazole (PRILOSEC) 40 MG capsule Take 40 mg by mouth daily as needed (acid reflux/heartburn).    Marland Kitchen oxyCODONE-acetaminophen (PERCOCET) 10-325 MG tablet Take 1 tablet by mouth 3 (three) times daily as needed for pain.    . predniSONE (DELTASONE) 5 MG tablet Take 5 mg by mouth daily with breakfast.     No current facility-administered medications for this visit.    ALLERGIES:  No Known Allergies  FAMILY HISTORY:   Family History  Problem Relation Age of Onset  . Heart disease Father   .  Diverticulosis Father   . Multiple sclerosis Sister   . Melanoma Sister   . Lung cancer Paternal Aunt   . Throat cancer Paternal Aunt   . Lung cancer Paternal Uncle   . Liver cancer Paternal Uncle     SOCIAL HISTORY:  The patient was born and raised in [place], [place].  He lives in [place].  He is single, with no children.  He previously did Architect work.  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  His smoking history is as mentioned per HPI.  He also admits to heavy alcohol use, which he quit 15 years ago.  REVIEW OF SYSTEMS:  Review of Systems  Constitutional:  Negative for fatigue, fever and unexpected weight change.  Respiratory:  Positive for cough and shortness of breath. Negative for chest tightness and hemoptysis.   Cardiovascular:  Negative for chest pain and palpitations.  Gastrointestinal:  Negative for abdominal distention, abdominal pain, blood in stool, constipation, diarrhea, nausea and vomiting.  Genitourinary:  Negative for dysuria, frequency and hematuria.   Musculoskeletal:  Positive for arthralgias. Negative for back pain and myalgias.  Skin:  Negative for itching and rash.  Neurological:  Negative for dizziness, headaches and light-headedness.  Psychiatric/Behavioral:  Negative for depression and suicidal ideas. The patient is not nervous/anxious.      PHYSICAL EXAM:  Blood pressure 129/60, pulse (!) 105, temperature 98.7 F (37.1 C), resp. rate 20, height 6\' 3"  (1.905 m), weight 231 lb 4.8 oz (104.9 kg), SpO2 90 %. Wt Readings from Last 3 Encounters:  07/29/22 231 lb 4.8 oz (104.9 kg)  07/09/22 228 lb (103.4 kg)  07/08/22 222 lb 12.8 oz (101.1 kg)   Body mass index is 28.91 kg/m. Performance status (ECOG): 1 - Symptomatic but completely ambulatory Physical Exam Constitutional:      Appearance: Normal appearance. He is not ill-appearing.  HENT:     Mouth/Throat:     Mouth: Mucous membranes are moist.     Pharynx: Oropharynx is clear.  No oropharyngeal exudate or posterior oropharyngeal erythema.  Cardiovascular:     Rate and Rhythm: Normal rate and regular rhythm.     Heart sounds: No murmur heard.    No friction rub. No gallop.  Pulmonary:     Effort: Pulmonary effort is normal. No respiratory distress.     Breath sounds: Examination of the left-upper field reveals rhonchi. Examination of the left-middle field reveals rhonchi. Examination of the left-lower field reveals rhonchi. Rhonchi present. No wheezing or rales.  Abdominal:     General: Bowel sounds are normal. There is no distension.     Palpations: Abdomen is soft. There is no mass.     Tenderness: There is no abdominal tenderness.  Musculoskeletal:        General: No swelling.  Right lower leg: No edema.     Left lower leg: No edema.  Lymphadenopathy:     Cervical: No cervical adenopathy.     Upper Body:     Right upper body: No supraclavicular or axillary adenopathy.     Left upper body: No supraclavicular or axillary adenopathy.     Lower Body: No right inguinal adenopathy. No left inguinal adenopathy.  Skin:    General: Skin is warm.     Coloration: Skin is not jaundiced.     Findings: No lesion or rash.  Neurological:     General: No focal deficit present.     Mental Status: He is alert and oriented to person, place, and time. Mental status is at baseline.  Psychiatric:        Mood and Affect: Mood normal.        Behavior: Behavior normal.        Thought Content: Thought content normal.   LABS:      Latest Ref Rng & Units 07/08/2022    2:00 PM 12/04/2020    5:12 AM 12/03/2020   10:20 AM  CBC  WBC 4.0 - 10.5 K/uL 9.3  7.6  9.0   Hemoglobin 13.0 - 17.0 g/dL 16.5  14.3  15.0   Hematocrit 39.0 - 52.0 % 51.0  43.8  48.4   Platelets 150 - 400 K/uL 215  206  233       Latest Ref Rng & Units 07/08/2022    2:00 PM 12/05/2020    6:40 AM 12/04/2020    5:12 AM  CMP  Glucose 70 - 99 mg/dL 227  114  99   BUN 8 - 23 mg/dL 29  25  23    Creatinine  0.61 - 1.24 mg/dL 1.54  1.09  1.16   Sodium 135 - 145 mmol/L 136  133  132   Potassium 3.5 - 5.1 mmol/L 4.2  4.3  4.8   Chloride 98 - 111 mmol/L 101  97  96   CO2 22 - 32 mmol/L 23  24  25    Calcium 8.9 - 10.3 mg/dL 8.8  8.5  8.6   Total Protein 6.5 - 8.1 g/dL 6.7     Total Bilirubin 0.3 - 1.2 mg/dL 0.4     Alkaline Phos 38 - 126 U/L 54     AST 15 - 41 U/L 13     ALT 0 - 44 U/L 20       STUDIES:  DG C-ARM BRONCHOSCOPY  Result Date: 07/09/2022 C-ARM BRONCHOSCOPY: Fluoroscopy was utilized by the requesting physician.  No radiographic interpretation.   DG C-Arm 1-60 Min-No Report  Result Date: 07/09/2022 Fluoroscopy was utilized by the requesting physician.  No radiographic interpretation.   DG Chest 2 View  Result Date: 07/08/2022 CLINICAL DATA:  Pre-op for lung nodule localization. EXAM: CHEST - 2 VIEW COMPARISON:  Radiographs 06/26/2022.  PET-CT 05/27/2022. FINDINGS: The heart size and mediastinal contours are stable. The lungs are hyperinflated with diffuse central airway thickening and biapical scarring. The dominant nodule posteriorly in the left upper lobe is not well visualized radiographically, but grossly unchanged. No pleural effusion or pneumothorax. No acute osseous findings. IMPRESSION: Radiographically stable appearance of the chest with findings of chronic obstructive pulmonary disease. Known left upper lobe nodule is not well seen radiographically. Electronically Signed   By: Richardean Sale M.D.   On: 07/08/2022 14:50   CT Super D Chest Wo Contrast  Result Date: 07/08/2022 CLINICAL DATA:  Hypermetabolic left upper lobe pulmonary nodule. Preoperative planning. * Tracking Code: BO * EXAM: CT CHEST WITHOUT CONTRAST TECHNIQUE: Multidetector CT imaging of the chest was performed using thin slice collimation for electromagnetic bronchoscopy planning purposes, without intravenous contrast. RADIATION DOSE REDUCTION: This exam was performed according to the departmental  dose-optimization program which includes automated exposure control, adjustment of the mA and/or kV according to patient size and/or use of iterative reconstruction technique. COMPARISON:  Chest CT 04/21/2022. PET-CT 05/27/2022. Chest CTA 12/03/2020. FINDINGS: Cardiovascular: Diffuse atherosclerosis of the aorta, great vessels and coronary arteries. No acute vascular findings on noncontrast imaging. The heart size is normal. There is no pericardial effusion. Mediastinum/Nodes: There are no enlarged mediastinal, hilar or axillary lymph nodes.Small mediastinal lymph nodes are unchanged, some of which were hypermetabolic on PET-CT. The thyroid gland, trachea and esophagus demonstrate no significant findings. Lungs/Pleura: No pleural effusion or pneumothorax. Centrilobular and paraseptal emphysema again noted with diffuse central airway thickening and biapical scarring. Irregular left upper lobe nodule which was hypermetabolic on PET-CT is again noted, similar to previous CT, measuring 1.9 x 0.9 cm on image 45/5. Adjacent 0.9 x 0.6 cm nodule is present on image 47/5, also stable. Other small left upper lobe nodules on images 53 and 55 of series 5 are unchanged. The diffuse peribronchovascular nodularity seen on the most recent CT has improved in the interval, with near complete resolution of the dominant nodular components in both lower lobes. No new or enlarging nodules are identified. Upper abdomen: The visualized upper abdomen appears stable without significant findings. Musculoskeletal/Chest wall: There is no chest wall mass or suspicious osseous finding. IMPRESSION: 1. Imaging for bronchoscopic planning and guidance. 2. The dominant hypermetabolic nodule posteriorly in the left upper lobe is not significantly changed, remaining suspicious for bronchogenic carcinoma. 3. Diffuse changes of smoking-related respiratory bronchiolitis have improved in the interval, with decreasing nodularity of both lung bases. No new or  enlarging nodules are identified. 4. Unchanged small mediastinal lymph nodes, some of which were hypermetabolic on PET-CT. 5. Coronary and aortic atherosclerosis (ICD10-I70.0). Emphysema (ICD10-J43.9). Electronically Signed   By: Richardean Sale M.D.   On: 07/08/2022 14:48     ASSESSMENT & PLAN:  A 71 y.o. male who I was asked to consult upon for what clinically appears to be stage IIIA (T1b N2 M0) adenocarcinoma of his left upper lobe.  In clinic today, I went over his biopsy and PET scan images with him.  I am somewhat concerned as his PET scan was done over 2 months ago.  As he has not undergone any treatment since that time, I am concerned about possible disease progression, including metastasis.  Although he had a CT scan in late August 2023, that scan was only focused on his chest.  Based upon this, I will arrange for him to undergo a PET scan in the forthcoming week. The patient brings to my attention that he is scheduled for a staging brain MRI later this week.  I will see him back in 1 week to go over his most up-to-date PET scan and brain MRI to determine if he has progressed beyond stage IIIA disease.  If not, he will be given chemoradiation followed by maintenance immunotherapy -all of which would be for curative intent.  All of this will be discussed at his next visit.  The patient understands all the plans discussed today and is in agreement with them.  I do appreciate [facility] for his new consult.   Dacoda Spallone A  Bobby Rumpf, MD

## 2022-07-29 ENCOUNTER — Encounter: Payer: Self-pay | Admitting: Oncology

## 2022-07-29 ENCOUNTER — Inpatient Hospital Stay: Payer: Medicare Other

## 2022-07-29 ENCOUNTER — Other Ambulatory Visit: Payer: Self-pay | Admitting: Oncology

## 2022-07-29 ENCOUNTER — Inpatient Hospital Stay: Payer: Medicare Other | Attending: Oncology | Admitting: Oncology

## 2022-07-29 DIAGNOSIS — C3490 Malignant neoplasm of unspecified part of unspecified bronchus or lung: Secondary | ICD-10-CM | POA: Insufficient documentation

## 2022-07-29 DIAGNOSIS — C3412 Malignant neoplasm of upper lobe, left bronchus or lung: Secondary | ICD-10-CM

## 2022-07-29 DIAGNOSIS — R911 Solitary pulmonary nodule: Secondary | ICD-10-CM

## 2022-08-07 LAB — FUNGUS CULTURE RESULT

## 2022-08-07 LAB — FUNGUS CULTURE WITH STAIN

## 2022-08-07 LAB — FUNGAL ORGANISM REFLEX

## 2022-08-14 NOTE — Progress Notes (Signed)
[facility]  [address] [place],  [place]  [address] [phone]  Clinic Day:  08/17/2022  Referring physician: [facility]*   HISTORY OF PRESENT ILLNESS:  The patient is a 71 y.o. 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  He comes in today to go over his PET scan and MRI images to determine if he has disease outside of his lungs.  Since his last visit, the patient has been doing okay.  He denies having any new respiratory symptoms or findings which concern him for overt signs of disease progression.  PHYSICAL EXAM:  Blood pressure 133/65, pulse (!) 111, temperature 98.3 F (36.8 C), resp. rate 20, height 6\' 3"  (1.905 m), weight 232 lb 14.4 oz (105.6 kg), SpO2 91 %. Wt Readings from Last 3 Encounters:  08/15/22 232 lb 14.4 oz (105.6 kg)  07/29/22 231 lb 4.8 oz (104.9 kg)  07/09/22 228 lb (103.4 kg)   Body mass index is 29.11 kg/m. Performance status (ECOG): 1 - Symptomatic but completely ambulatory Physical Exam Constitutional:      Appearance: Normal appearance. He is not ill-appearing.  HENT:     Mouth/Throat:     Mouth: Mucous membranes are moist.     Pharynx: Oropharynx is clear. No oropharyngeal exudate or posterior oropharyngeal erythema.  Cardiovascular:     Rate and Rhythm: Normal rate and regular rhythm.     Heart sounds: No murmur heard.    No friction rub. No gallop.  Pulmonary:     Effort: Pulmonary effort is normal. No respiratory distress.     Breath sounds: Examination of the left-upper field reveals rhonchi. Examination of the left-middle field reveals rhonchi. Examination of the left-lower field reveals rhonchi. Rhonchi present. No wheezing or rales.  Abdominal:     General: Bowel sounds are normal. There is no distension.     Palpations: Abdomen is soft. There is no mass.     Tenderness: There is no abdominal tenderness.   Musculoskeletal:        General: No swelling.     Right lower leg: No edema.     Left lower leg: No edema.  Lymphadenopathy:     Cervical: No cervical adenopathy.     Upper Body:     Right upper body: No supraclavicular or axillary adenopathy.     Left upper body: No supraclavicular or axillary adenopathy.     Lower Body: No right inguinal adenopathy. No left inguinal adenopathy.  Skin:    General: Skin is warm.     Coloration: Skin is not jaundiced.     Findings: No lesion or rash.  Neurological:     General: No focal deficit present.     Mental Status: He is alert and oriented to person, place, and time. Mental status is at baseline.  Psychiatric:        Mood and Affect: Mood normal.        Behavior: Behavior normal.        Thought Content: Thought content normal.   LABS:      Latest Ref Rng & Units 07/08/2022    2:00 PM 12/04/2020    5:12 AM 12/03/2020   10:20 AM  CBC  WBC 4.0 - 10.5 K/uL 9.3  7.6  9.0   Hemoglobin 13.0 - 17.0 g/dL 16.5  14.3  15.0   Hematocrit 39.0 - 52.0 % 51.0  43.8  48.4   Platelets 150 - 400 K/uL 215  206  233       Latest Ref Rng & Units 07/08/2022    2:00 PM 12/05/2020    6:40 AM 12/04/2020    5:12 AM  CMP  Glucose 70 - 99 mg/dL 227  114  99   BUN 8 - 23 mg/dL 29  25  23    Creatinine 0.61 - 1.24 mg/dL 1.54  1.09  1.16   Sodium 135 - 145 mmol/L 136  133  132   Potassium 3.5 - 5.1 mmol/L 4.2  4.3  4.8   Chloride 98 - 111 mmol/L 101  97  96   CO2 22 - 32 mmol/L 23  24  25    Calcium 8.9 - 10.3 mg/dL 8.8  8.5  8.6   Total Protein 6.5 - 8.1 g/dL 6.7     Total Bilirubin 0.3 - 1.2 mg/dL 0.4     Alkaline Phos 38 - 126 U/L 54     AST 15 - 41 U/L 13     ALT 0 - 44 U/L 20       STUDIES:  Most recent PET scan and brain MRI revealed the following: FINDINGS: Mediastinal blood pool activity: SUV max 2.49  Liver activity: SUV max NA  NECK: LEFT low neck/supraclavicular lymph nodes with increased metabolic activity.  (Image 26/4) maximum SUV of  4.4 in a 9 mm LEFT supraclavicular lymph node. Smaller group of lymph nodes on image 21/4 at level V B shows similar activity.  Incidental CT findings: None.  CHEST: LEFT upper lobe nodule 2.9 cm greatest axial dimension with irregular margins displays maximum SUV of 7.50 (image 37/4)  Scattered mediastinal nodes displaying increased metabolic activity most notably about the LEFT hilum (image 45/4) maximum SUV 9.92.  RIGHT paratracheal lymph node less than a cm maximum SUV of 3.11 (image 37/4) this measures less than a cm.  AP window/low LEFT paratracheal lymph node (image 43/4) maximum SUV 4.77 measuring 9 mm.  Other scattered lymph nodes along the LEFT paratracheal chain and smaller nodes in the anterior mediastinum with mild to moderate uptake in addition to LEFT supraclavicular/low neck lymph nodes described above.  In the contralateral lung there is an area of increased metabolic activity without focal lesion, maximum SUV 5.10. Nodular changes and ground-glass were present on the previous exam in this location.  Incidental CT findings: Aortic atherosclerosis. Coronary artery calcification with three-vessel disease. No pericardial effusion or nodularity. No additional lymph nodes in the chest with suspicious features. Background pulmonary emphysema.  ABDOMEN/PELVIS: No abnormal hypermetabolic activity within the liver, pancreas, adrenal glands, or spleen. No hypermetabolic lymph nodes in the abdomen or pelvis.  Incidental CT findings: No acute findings relative to liver, gallbladder, pancreas, spleen, adrenal glands, kidneys, urinary bladder and gastrointestinal tract. Aortic atherosclerosis without aneurysmal dilation of the abdominal aorta. No adenopathy by size criteria in the abdomen or in the pelvis.  SKELETON: Skin thickening over the LEFT lower quadrant (image 140/4) increased metabolic activity in this area with a maximum SUV of 8.91. This is focal and beyond  what would be expected for inflammatory changes within the subcutaneous tissues and dermal layer. There is also a small cutaneous lesion in the subxiphoid region (image 69/4) this is 11 mm and displays no increased metabolic activity. No signs of skeletal metastatic disease.  Incidental CT findings: None.  IMPRESSION: LEFT upper lobe bronchogenic neoplasm with ipsilateral and supraclavicular metastatic nodal disease.  RIGHT paratracheal  nodal activity also raising the question of contralateral nodal involvement.  Activity in the RIGHT lower lobe with resolution of area of discrete nodularity seen on the August 22nd evaluation and with improvement with respect to ground-glass in this location is favored to represent post infectious or inflammatory changes. Given that above findings related to the bronchogenic neoplasm in the LEFT upper lobe have increased and or worsened since previous imaging with new areas of nodal disease would suggest close attention to this area on follow-up.  Cutaneous activity in the LEFT lower quadrant is suspicious based on the substantial FDG uptake in the small area with corresponding CT abnormality for cutaneous neoplasm and should be evaluated with direct clinical inspection.  Additional area in the sub xiphoid region without increased metabolic activity along the dermal surface and in the subcutaneous fat.  Aortic atherosclerosis and pulmonary emphysema.  Aortic Atherosclerosis (ICD10-I70.0) and Emphysema (ICD10-J43.9).  ----------------------------------------------------------------------------------- FINDINGS: Brain:  Mild motion artifact on postcontrast imaging. No abnormal enhancement identified. No dural thickening identified. No midline shift, mass effect, or evidence of intracranial mass lesion.  No restricted diffusion to suggest acute infarction. No ventriculomegaly, extra-axial collection or acute intracranial hemorrhage.  Cervicomedullary junction and pituitary are within normal limits.  Pearline Cables and white matter signal is within normal limits for age throughout the brain. No cortical encephalomalacia or chronic cerebral blood products identified.  Vascular: Major intracranial vascular flow voids are preserved. The major dural venous sinuses are enhancing and appear to be patent.  Skull and upper cervical spine: Negative visible cervical spine and spinal cord. Visualized bone marrow signal is within normal limits.  Sinuses/Orbits: Negative orbits. Scattered mild paranasal sinus mucosal thickening or small retention cysts.  Other: Mastoids are clear. Grossly normal visible internal auditory structures. Negative visible scalp and face.  IMPRESSION: No metastatic disease or acute intracranial abnormality identified.  Normal for age MRI appearance of the brain. ASSESSMENT & PLAN:  A 71 y.o. male who clinically appears to be stage IIIB (T1b N3 M0) adenocarcinoma of his left upper lobe.  This patient is based upon his PET scan showing a right paratracheal lymph node, which is consistent with contralateral nodal involvement.  Nevertheless, as he does have stage III disease, I do believe this gentleman will be best served with concurrent chemoradiation, consisting of weekly carboplatin/paclitaxel.  I will arrange for him to have a port placed through which all of his IV chemotherapy will be given.  He will also be referred to radiation oncology to discuss the radiation component of his combined therapy.  The patient also understands that if he has a good response to his chemoradiation, I would also consider adjuvant immunotherapy for 1 year to further decrease his chances of this disease coming back.  Despite having a high risk of disease recurrence, the patient understands that all treatment to be given will be with the goal of ultimately curing him of his disease.  I anticipate his chemoradiation to commence within the  next 2-3 weeks.  I will see him back in 1 month for repeat clinical assessment.  The patient understands all the plans discussed today and is in agreement with them.  Leianna Barga Macarthur Critchley, MD

## 2022-08-15 ENCOUNTER — Inpatient Hospital Stay (INDEPENDENT_AMBULATORY_CARE_PROVIDER_SITE_OTHER): Payer: Medicare Other | Admitting: Oncology

## 2022-08-15 VITALS — BP 133/65 | HR 111 | Temp 98.3°F | Resp 20 | Ht 75.0 in | Wt 232.9 lb

## 2022-08-15 DIAGNOSIS — C3412 Malignant neoplasm of upper lobe, left bronchus or lung: Secondary | ICD-10-CM

## 2022-08-17 ENCOUNTER — Encounter: Payer: Self-pay | Admitting: Oncology

## 2022-08-17 NOTE — Progress Notes (Signed)
START ON PATHWAY REGIMEN - Non-Small Cell Lung     A cycle is every 7 days, concurrent with RT:     Paclitaxel      Carboplatin   **Always confirm dose/schedule in your pharmacy ordering system**  Patient Characteristics: Preoperative or Nonsurgical Candidate (Clinical Staging), Stage III - Nonsurgical Candidate (Nonsquamous and Squamous), PS = 0, 1 Therapeutic Status: Preoperative or Nonsurgical Candidate (Clinical Staging) AJCC T Category: cT1b AJCC N Category: cN3 AJCC M Category: cM0 AJCC 8 Stage Grouping: IIIB ECOG Performance Status: 1 Intent of Therapy: Curative Intent, Discussed with Patient

## 2022-08-18 ENCOUNTER — Other Ambulatory Visit: Payer: Self-pay

## 2022-08-21 ENCOUNTER — Encounter: Payer: Self-pay | Admitting: Oncology

## 2022-08-21 LAB — ACID FAST CULTURE WITH REFLEXED SENSITIVITIES (MYCOBACTERIA): Acid Fast Culture: NEGATIVE

## 2022-08-28 ENCOUNTER — Other Ambulatory Visit: Payer: Self-pay

## 2022-08-29 ENCOUNTER — Encounter: Payer: Self-pay | Admitting: Hematology and Oncology

## 2022-08-29 ENCOUNTER — Inpatient Hospital Stay: Payer: Medicare Other

## 2022-08-29 ENCOUNTER — Encounter: Payer: Self-pay | Admitting: Oncology

## 2022-08-29 ENCOUNTER — Inpatient Hospital Stay: Payer: Medicare Other | Attending: Oncology | Admitting: Hematology and Oncology

## 2022-08-29 VITALS — BP 121/68 | HR 105 | Temp 98.0°F | Resp 20 | Ht 75.0 in | Wt 231.2 lb

## 2022-08-29 DIAGNOSIS — C3412 Malignant neoplasm of upper lobe, left bronchus or lung: Secondary | ICD-10-CM

## 2022-08-29 DIAGNOSIS — Z5111 Encounter for antineoplastic chemotherapy: Secondary | ICD-10-CM | POA: Insufficient documentation

## 2022-08-29 LAB — HEPATIC FUNCTION PANEL
ALT: 23 U/L (ref 10–40)
AST: 23 (ref 14–40)
Alkaline Phosphatase: 66 (ref 25–125)
Bilirubin, Total: 0.6

## 2022-08-29 LAB — BASIC METABOLIC PANEL
BUN: 20 (ref 4–21)
CO2: 28 — AB (ref 13–22)
Chloride: 101 (ref 99–108)
Creatinine: 1 (ref 0.6–1.3)
Glucose: 137
Potassium: 4.1 mEq/L (ref 3.5–5.1)
Sodium: 136 — AB (ref 137–147)

## 2022-08-29 LAB — CBC AND DIFFERENTIAL
HCT: 51 (ref 41–53)
Hemoglobin: 16.7 (ref 13.5–17.5)
MCV: 89 (ref 80–94)
Neutrophils Absolute: 5.47
Platelets: 197 10*3/uL (ref 150–400)
WBC: 7.7

## 2022-08-29 LAB — CBC: RBC: 5.69 — AB (ref 3.87–5.11)

## 2022-08-29 LAB — COMPREHENSIVE METABOLIC PANEL
Albumin: 4.2 (ref 3.5–5.0)
Calcium: 9.3 (ref 8.7–10.7)

## 2022-08-29 MED ORDER — PROCHLORPERAZINE MALEATE 10 MG PO TABS: 10.0000 mg | ORAL_TABLET | Freq: Four times a day (QID) | ORAL | 1 refills | Status: DC | PRN

## 2022-08-29 MED ORDER — ONDANSETRON HCL 8 MG PO TABS: 8.0000 mg | ORAL_TABLET | Freq: Three times a day (TID) | ORAL | 1 refills | Status: DC | PRN

## 2022-08-29 NOTE — Progress Notes (Unsigned)
[place] [phone]   [phone]   Patient Care Team: [facility] as PCP - General Gardiner Rhyme, MD as Referring Physician (Specialist) Marice Potter, MD as Consulting Physician (Oncology)   Name of the patient: Allen [name]  [id number]  Nov 12, 1951   HEMATOLOGY-ONCOLOGY TeleHEALTH VISIT PROGRESS NOTE:  I connected with Tawny Hopping on 08/29/2022 at 11:15 am by video and verified that I was speaking with the correct person using two identifiers. I discussed the limitations, risks, security, and privacy concerns of performing an evaluation and management service by telemedicine and the availability of in-person appointments. I also discussed with the patient that there may be a patient responsible charge related to this service. The patient expressed understanding and agreed to proceed.  Other persons participating in the visit and their role in the encounter: Susy Frizzle, LPN to provide written information and obtain signed consent Patient's location: [facility] Provider's location: Home Chief Complaint: Starting chemotherapy for lung cancer  Date of visit: 08/29/22  Diagnosis-  1. Malignant neoplasm of upper lobe of left lung [facility])     Chief complaint/Reason for visit- Initial Meeting for [facility], preparing for starting chemotherapy  Heme/Onc history:  Oncology History  Lung cancer ([place])  07/29/2022 Initial Diagnosis   Lung cancer ([place])   09/02/2022 -  Chemotherapy   Patient is on Treatment Plan : LUNG Carboplatin + Paclitaxel + XRT q7d       Interval history-  The patient presents to chemo care clinic today for initial meeting in preparation for starting chemotherapy. I introduced the chemo care clinic and we discussed that the role of the clinic is to assist those who are at an increased risk of emergency room visits and/or complications  during the course of chemotherapy treatment. We discussed that the increased risk takes into account factors such as age, performance status, and co-morbidities. We also discussed that for some, this might include barriers to care such as not having a primary care provider, lack of insurance/transportation, or not being able to afford medications. We discussed that the goal of the program is to help prevent unplanned ER visits and help reduce complications during chemotherapy. We do this by discussing specific risk factors to each individual and identifying ways that we can help improve these risk factors and reduce barriers to care.   No Known Allergies  Past Medical History:  Diagnosis Date   Arthritis    left knee and right shoulder   COPD (chronic obstructive pulmonary disease) (HCC)    Coronary atherosclerosis    Diabetes mellitus without complication (HCC)    GERD (gastroesophageal reflux disease)    History of hiatal hernia    Hypertension    Lung cancer ([place])    Pneumonia 06/2022   Thoracic aortic atherosclerosis [facility])     Past Surgical History:  Procedure Laterality Date   APPENDECTOMY  1963   CARDIAC CATHETERIZATION     Per pt, had one in the 1990s   COLONOSCOPY  2021   ELBOW SURGERY Left    LUNG BIOPSY     PROSTATE SURGERY  2019   Urolift implanted   VIDEO BRONCHOSCOPY WITH ENDOBRONCHIAL NAVIGATION N/A 07/09/2022   Procedure: VIDEO BRONCHOSCOPY WITH ENDOBRONCHIAL NAVIGATION;  Surgeon: Melrose Nakayama, MD;  Location: [facility];  Service: Thoracic;  Laterality: N/A;   VIDEO BRONCHOSCOPY WITH ENDOBRONCHIAL ULTRASOUND N/A 07/09/2022   Procedure: VIDEO BRONCHOSCOPY WITH ENDOBRONCHIAL  ULTRASOUND;  Surgeon: Melrose Nakayama, MD;  Location: [facility] OR;  Service: Thoracic;  Laterality: N/A;    Social History   Socioeconomic History   Marital status: Single    Spouse name: Not on file   Number of children: 0   Years of education: 11   Highest education level: Not on file   Occupational History   Occupation: RETIRED CONSTRUCTION / [employer]  Tobacco Use   Smoking status: Every Day    Packs/day: 1.00    Types: Cigarettes   Smokeless tobacco: Not on file  Vaping Use   Vaping Use: Never used  Substance and Sexual Activity   Alcohol use: Yes    Comment: occasionally   Drug use: Never   Sexual activity: Yes  Other Topics Concern   Not on file  Social History Narrative   Not on file   Social Determinants of Health   Financial Resource Strain: Not on file  Food Insecurity: Not on file  Transportation Needs: Not on file  Physical Activity: Not on file  Stress: Not on file  Social Connections: Not on file  Intimate Partner Violence: Not on file    Family History  Problem Relation Age of Onset   Heart disease Father    Diverticulosis Father    Multiple sclerosis Sister    Melanoma Sister    Lung cancer Paternal Aunt    Throat cancer Paternal Aunt    Lung cancer Paternal Uncle    Liver cancer Paternal Uncle      Current Outpatient Medications:    ACCU-CHEK GUIDE test strip, USE TO CHECK BLOOD SUGAR TWICE DAILY BEFORE MEALS, Disp: , Rfl:    albuterol (VENTOLIN HFA) 108 (90 Base) MCG/ACT inhaler, Inhale 2 puffs into the lungs every 4 (four) hours as needed for wheezing or shortness of breath., Disp: 18 g, Rfl: 0   aspirin EC 81 MG tablet, Take 81 mg by mouth daily. Swallow whole., Disp: , Rfl:    FARXIGA 10 MG TABS tablet, Take 10 mg by mouth daily., Disp: , Rfl:    furosemide (LASIX) 20 MG tablet, Take 20 mg by mouth daily as needed (leg swelling)., Disp: , Rfl:    hydrochlorothiazide (HYDRODIURIL) 25 MG tablet, Take 25 mg by mouth daily., Disp: , Rfl:    ipratropium-albuterol (DUONEB) 0.5-2.5 (3) MG/3ML SOLN, Take 3 mLs by nebulization every 6 (six) hours as needed (Asthma)., Disp: , Rfl:    lisinopril (ZESTRIL) 20 MG tablet, Take 20 mg by mouth daily., Disp: , Rfl:    metFORMIN (GLUCOPHAGE) 500 MG tablet, Take 500 mg by mouth daily.,  Disp: , Rfl:    omeprazole (PRILOSEC) 40 MG capsule, Take 40 mg by mouth daily as needed (acid reflux/heartburn)., Disp: , Rfl:    ondansetron (ZOFRAN) 8 MG tablet, Take 1 tablet (8 mg total) by mouth every 8 (eight) hours as needed for nausea or vomiting. Start on the third day after chemotherapy., Disp: 30 tablet, Rfl: 1   oxyCODONE-acetaminophen (PERCOCET) 10-325 MG tablet, Take 1 tablet by mouth 3 (three) times daily as needed for pain., Disp: , Rfl:    predniSONE (DELTASONE) 5 MG tablet, Take 5 mg by mouth daily with breakfast., Disp: , Rfl:    prochlorperazine (COMPAZINE) 10 MG tablet, Take 1 tablet (10 mg total) by mouth every 6 (six) hours as needed for nausea or vomiting., Disp: 30 tablet, Rfl: 1   TRELEGY ELLIPTA 200-62.5-25 MCG/ACT AEPB, Inhale 1 puff into the lungs daily., Disp: ,  Rfl:      Latest Ref Rng & Units 08/29/2022   12:00 AM  CMP  BUN 4 - 21 20      Creatinine 0.6 - 1.3 1.0      Sodium 137 - 147 136      Potassium 3.5 - 5.1 mEq/L 4.1      Chloride 99 - 108 101      CO2 13 - 22 28      Calcium 8.7 - 10.7 9.3      Alkaline Phos 25 - 125 66      AST 14 - 40 23      ALT 10 - 40 U/L 23         This result is from an external source.      Latest Ref Rng & Units 08/29/2022   12:00 AM  CBC  WBC  7.7      Hemoglobin 13.5 - 17.5 16.7      Hematocrit 41 - 53 51      Platelets 150 - 400 K/uL 197         This result is from an external source.    No images are attached to the encounter.  No results found.   Assessment and plan- Patient is a 71 y.o. male who presents to [facility] for initial meeting in preparation for starting chemoradiation for the treatment of non-small cell lung cancer.   Chemo Care Clinic/High Risk for ER/Hospitalization during chemotherapy- We discussed the role of the chemo care clinic and identified patient specific risk factors. I discussed that patient was identified as high risk primarily based on:  Patient has past medical  history positive for: Past Medical History:  Diagnosis Date   Arthritis    left knee and right shoulder   COPD (chronic obstructive pulmonary disease) (HCC)    Coronary atherosclerosis    Diabetes mellitus without complication (HCC)    GERD (gastroesophageal reflux disease)    History of hiatal hernia    Hypertension    Lung cancer ([place])    Pneumonia 06/2022   Thoracic aortic atherosclerosis ([place])     Patient has past surgical history positive for: Past Surgical History:  Procedure Laterality Date   [place]     Per pt, had one in the 1990s   COLONOSCOPY  2021   ELBOW SURGERY Left    LUNG BIOPSY     PROSTATE SURGERY  2019   Urolift implanted   VIDEO BRONCHOSCOPY WITH ENDOBRONCHIAL NAVIGATION N/A 07/09/2022   Procedure: VIDEO BRONCHOSCOPY WITH ENDOBRONCHIAL NAVIGATION;  Surgeon: Melrose Nakayama, MD;  Location: [place];  Service: Thoracic;  Laterality: N/A;   VIDEO BRONCHOSCOPY WITH ENDOBRONCHIAL ULTRASOUND N/A 07/09/2022   Procedure: VIDEO BRONCHOSCOPY WITH ENDOBRONCHIAL ULTRASOUND;  Surgeon: Melrose Nakayama, MD;  Location: [place];  Service: Thoracic;  Laterality: N/A;    Provided general information including the following:  1.  Date of education: 08/29/2022 2.  Physician name: Lavera Guise, MD 3.  Diagnosis: Non-small cell lung cancer 4.  Stage: IIIC 5.  Cure 6.  Chemotherapy plan including drugs and how often: paclitaxel/carboplatin weekly for 6 weeks 7.  Start date: 09/02/2022 8.  Other referrals: None at this time 9.  The patient is to call our office with any questions or concerns.  Our office number [phone], if after hours or on the weekend, call the same number and wait for the answering service.  There is always  an oncologist on call. 10.  Medications prescribed: ondansetron, prochlorperazine 11.  The patient has verbalized understanding of the treatment plan and has no barriers to adherence or understanding.    Obtained signed consent from patient.   Discussed symptoms including:  1.  Low blood counts including red blood cells, white blood cells and platelets. 2. Infection including to avoid large crowds, wash hands frequently, and stay away from people who were sick.  If fever develops of 100.4 or higher, call our office. 3.  Mucositis:  given instructions on mouth rinse (baking soda and salt mixture).  Keep mouth clean.  Use soft bristle toothbrush.  If mouth sores develop, call our clinic. 4.  Nausea/vomiting:  gave prescriptions for ondansetron 4 mg every 4 hours as needed for nausea, may take around the clock if persistent.  Prochlorperazine 10 mg every 6 hours, may take around the clock if persistent. 5.  Diarrhea: use over-the-counter Imodium.  Call clinic if not controlled. 6.  Constipation: use senna-S, 1 to 2 tablets twice a day.  If no BM in 2 to 3 days call the clinic. 7.  Loss of appetite:  try to eat small meals every 2-3 hours.  Call clinic if not eating or drinking. 8.  Taste changes:  zinc 500 mg daily.  If becomes severe call clinic. 9.  Avoid alcoholic beverages. 10.  Drink 2 to 3 quarts of water per day. Call clinic if not able to drink enough for urine to be pale yellow. 11.  Peripheral neuropathy: patient to call if numbness or tingling in hands or feet is persistent     The patient was given written information printed from [employer] patient education on individual chemotherapy agents which includes: Name of medications Approved uses Dose and schedule Storage and handling Handling body fluids and waste Drug and food interactions Possible side effects and management Pregnancy, sexual activity, and contraception Obtaining medication   Gave information on the supportive care team and how to contact them regarding services.  Discussed advanced directives.  The patient does not have their advanced directives.   We discussed that social determinants of health may have  significant impacts on health and outcomes for cancer patients.  Today we discussed specific social determinants of performance status, alcohol use, depression, financial needs, food insecurity, housing, interpersonal violence, social connections, stress, tobacco use, and transportation.    After lengthy discussion the following were identified as areas of need:   Outpatient services: We discussed options including home based and outpatient services, DME, nutrition counseling, and supportive care program. We discussed that patients who participate in regular physical activity report fewer negative impacts of cancer and treatments and report less fatigue.   Financial Concerns: We discussed that living with cancer can create tremendous financial burden.  We discussed options for assistance. I asked that if assistance is needed in affording medications or paying bills to please let us know so that we can provide assistance. We discussed options for food including social services.  Referral to Social work: Introduced Education officer, museum Mort Sawyers and the services she can provide, such as support with utility bill, cell phone and gas vouchers.  Also introduced to [employer], LCSW, our licensed clinical social worker who provides counseling as needed.  Support groups: We discussed options for support groups at [facility]. We discussed options for managing stress including healthy eating and exercise, as well as participating in no charge counseling services at the cancer center and support groups.  If these  are of interest, patient can notify either myself or primary nursing team.We discussed options for management including medications.  Transportation: We discussed options for transportation.  The patient will contact our office if he requires assistance with transportation.  Palliative care services: We have palliative care services available in the cancer center to discuss goals of care and advanced  care planning.  Please let us know if you have any questions or would like to speak to our palliative care practitioner.  Symptom Management Clinic: We discussed our symptom management clinic which is available for acute concerns while receiving treatment such as nausea, vomiting or diarrhea.  We can be reached via telephone at [phone].  We are available for virtual or in person visits on the same day from 9 to 4 PM Monday through Friday.   He denies needing specific assistance at this time.  He will be followed by Dr. Jaclyn Shaggy clinical team.   Disposition: RTC on 09/05/2022  Visit Diagnosis 1. Malignant neoplasm of upper lobe of left lung ([place])     I discussed the assessment and treatment plan with the patient.  The patient was provided an opportunity to ask questions and all were answered. The patient expressed understanding and was in agreement with this plan. He also understands that he can call clinic at any time with any questions, concerns, or complaints.   I provided 30 minutes of face-to-face video visit time during this encounter, and > 50% was spent counseling as documented under my assessment & plan.   Russel Morain A. Georgann Housekeeper, [place] [phone]

## 2022-09-01 ENCOUNTER — Encounter: Payer: Self-pay | Admitting: Hematology and Oncology

## 2022-09-01 MED FILL — Dexamethasone Sodium Phosphate Inj 100 MG/10ML: INTRAMUSCULAR | Qty: 1 | Status: AC

## 2022-09-01 MED FILL — Paclitaxel IV Conc 300 MG/50ML (6 MG/ML): INTRAVENOUS | Qty: 18 | Status: AC

## 2022-09-02 ENCOUNTER — Inpatient Hospital Stay: Payer: Medicare Other

## 2022-09-02 VITALS — BP 104/69 | HR 94 | Temp 98.9°F | Resp 18 | Ht 75.0 in | Wt 235.1 lb

## 2022-09-02 DIAGNOSIS — Z5111 Encounter for antineoplastic chemotherapy: Secondary | ICD-10-CM | POA: Diagnosis present

## 2022-09-02 DIAGNOSIS — C3412 Malignant neoplasm of upper lobe, left bronchus or lung: Secondary | ICD-10-CM | POA: Diagnosis present

## 2022-09-02 MED ORDER — SODIUM CHLORIDE 0.9% FLUSH
10.0000 mL | INTRAVENOUS | Status: DC | PRN
  Administered 2022-09-02: 10 mL

## 2022-09-02 MED ORDER — DIPHENHYDRAMINE HCL 50 MG/ML IJ SOLN
50.0000 mg | Freq: Once | INTRAMUSCULAR | Status: AC
  Administered 2022-09-02: 50 mg via INTRAVENOUS
  Filled 2022-09-02: qty 1

## 2022-09-02 MED ORDER — SODIUM CHLORIDE 0.9 % IV SOLN
45.0000 mg/m2 | Freq: Once | INTRAVENOUS | Status: AC
  Administered 2022-09-02: 108 mg via INTRAVENOUS
  Filled 2022-09-02: qty 18

## 2022-09-02 MED ORDER — SODIUM CHLORIDE 0.9 % IV SOLN
10.0000 mg | Freq: Once | INTRAVENOUS | Status: AC
  Administered 2022-09-02: 10 mg via INTRAVENOUS
  Filled 2022-09-02: qty 10

## 2022-09-02 MED ORDER — HEPARIN SOD (PORK) LOCK FLUSH 100 UNIT/ML IV SOLN
500.0000 [IU] | Freq: Once | INTRAVENOUS | Status: AC | PRN
  Administered 2022-09-02: 500 [IU]

## 2022-09-02 MED ORDER — PALONOSETRON HCL INJECTION 0.25 MG/5ML
0.2500 mg | Freq: Once | INTRAVENOUS | Status: AC
  Administered 2022-09-02: 0.25 mg via INTRAVENOUS
  Filled 2022-09-02: qty 5

## 2022-09-02 MED ORDER — FAMOTIDINE IN NACL 20-0.9 MG/50ML-% IV SOLN
20.0000 mg | Freq: Once | INTRAVENOUS | Status: AC
  Administered 2022-09-02: 20 mg via INTRAVENOUS
  Filled 2022-09-02: qty 50

## 2022-09-02 MED ORDER — SODIUM CHLORIDE 0.9 % IV SOLN: Freq: Once | INTRAVENOUS | Status: AC

## 2022-09-02 MED ORDER — SODIUM CHLORIDE 0.9 % IV SOLN
234.0000 mg | Freq: Once | INTRAVENOUS | Status: AC
  Administered 2022-09-02: 230 mg via INTRAVENOUS
  Filled 2022-09-02: qty 23

## 2022-09-02 NOTE — Patient Instructions (Signed)
Allen [name]  Discharge Instructions: Thank you for choosing [facility] to provide your oncology and hematology care.  If you have a lab appointment with the [facility], please go directly to the [facility] and check in at the registration area.   Wear comfortable clothing and clothing appropriate for easy access to any Portacath or PICC line.   We strive to give you quality time with your provider. You may need to reschedule your appointment if you arrive late (15 or more minutes).  Arriving late affects you and other patients whose appointments are after yours.  Also, if you miss three or more appointments without notifying the office, you may be dismissed from the clinic at the provider's discretion.      For prescription refill requests, have your pharmacy contact our office and allow 72 hours for refills to be completed.    Today you received the following chemotherapy and/or immunotherapy agents    To help prevent nausea and vomiting after your treatment, we encourage you to take your nausea medication as directed.  BELOW ARE SYMPTOMS THAT SHOULD BE REPORTED IMMEDIATELY: *FEVER GREATER THAN 100.4 F (38 C) OR HIGHER *CHILLS OR SWEATING *NAUSEA AND VOMITING THAT IS NOT CONTROLLED WITH YOUR NAUSEA MEDICATION *UNUSUAL SHORTNESS OF BREATH *UNUSUAL BRUISING OR BLEEDING *URINARY PROBLEMS (pain or burning when urinating, or frequent urination) *BOWEL PROBLEMS (unusual diarrhea, constipation, pain near the anus) TENDERNESS IN MOUTH AND THROAT WITH OR WITHOUT PRESENCE OF ULCERS (sore throat, sores in mouth, or a toothache) UNUSUAL RASH, SWELLING OR PAIN  UNUSUAL VAGINAL DISCHARGE OR ITCHING   Items with * indicate a potential emergency and should be followed up as soon as possible or go to the Emergency Department if any problems should occur.  Please show the CHEMOTHERAPY ALERT CARD or IMMUNOTHERAPY ALERT CARD at check-in to the Emergency  Department and triage nurse.  Should you have questions after your visit or need to cancel or reschedule your appointment, please contact [facility]  Dept: [phone]  and follow the prompts.  Office hours are 8:00 a.m. to 4:30 p.m. Monday - Friday. Please note that voicemails left after 4:00 p.m. may not be returned until the following business day.  We are closed weekends and major holidays. You have access to a nurse at all times for urgent questions. Please call the main number to the clinic Dept: [phone] and follow the prompts.  For any non-urgent questions, you may also contact your provider using MyChart. We now offer e-Visits for anyone 56 and older to request care online for non-urgent symptoms. For details visit [email or web address].   Also download the MyChart app! Go to the app store, search "MyChart", open the app, select [facility], and log in with your MyChart username and password.  Masks are optional in the cancer centers. If you would like for your care team to wear a mask while they are taking care of you, please let them know. You may have one support person who is at least 71 years old accompany you for your appointments. Paclitaxel Injection What is this medication? PACLITAXEL (PAK li TAX el) treats some types of cancer. It works by slowing down the growth of cancer cells. This medicine may be used for other purposes; ask your health care provider or pharmacist if you have questions. COMMON BRAND NAME(S): Onxol, Taxol What should I tell my care team before I take this medication? They need to know if  you have any of these conditions: Heart disease Liver disease Low white blood cell levels An unusual or allergic reaction to paclitaxel, other medications, foods, dyes, or preservatives If you or your partner are pregnant or trying to get pregnant Breast-feeding How should I use this medication? This medication is injected into a vein. It  is given by your care team in a hospital or clinic setting. Talk to your care team about the use of this medication in children. While it may be given to children for selected conditions, precautions do apply. Overdosage: If you think you have taken too much of this medicine contact a poison control center or emergency room at once. NOTE: This medicine is only for you. Do not share this medicine with others. What if I miss a dose? Keep appointments for follow-up doses. It is important not to miss your dose. Call your care team if you are unable to keep an appointment. What may interact with this medication? Do not take this medication with any of the following: Live virus vaccines Other medications may affect the way this medication works. Talk with your care team about all of the medications you take. They may suggest changes to your treatment plan to lower the risk of side effects and to make sure your medications work as intended. This list may not describe all possible interactions. Give your health care provider a list of all the medicines, herbs, non-prescription drugs, or dietary supplements you use. Also tell them if you smoke, drink alcohol, or use illegal drugs. Some items may interact with your medicine. What should I watch for while using this medication? Your condition will be monitored carefully while you are receiving this medication. You may need blood work while taking this medication. This medication may make you feel generally unwell. This is not uncommon as chemotherapy can affect healthy cells as well as cancer cells. Report any side effects. Continue your course of treatment even though you feel ill unless your care team tells you to stop. This medication can cause serious allergic reactions. To reduce the risk, your care team may give you other medications to take before receiving this one. Be sure to follow the directions from your care team. This medication may increase your  risk of getting an infection. Call your care team for advice if you get a fever, chills, sore throat, or other symptoms of a cold or flu. Do not treat yourself. Try to avoid being around people who are sick. This medication may increase your risk to bruise or bleed. Call your care team if you notice any unusual bleeding. Be careful brushing or flossing your teeth or using a toothpick because you may get an infection or bleed more easily. If you have any dental work done, tell your dentist you are receiving this medication. Talk to your care team if you may be pregnant. Serious birth defects can occur if you take this medication during pregnancy. Talk to your care team before breastfeeding. Changes to your treatment plan may be needed. What side effects may I notice from receiving this medication? Side effects that you should report to your care team as soon as possible: Allergic reactions--skin rash, itching, hives, swelling of the face, lips, tongue, or throat Heart rhythm changes--fast or irregular heartbeat, dizziness, feeling faint or lightheaded, chest pain, trouble breathing Increase in blood pressure Infection--fever, chills, cough, sore throat, wounds that don't heal, pain or trouble when passing urine, general feeling of discomfort or being unwell Low blood  pressure--dizziness, feeling faint or lightheaded, blurry vision Low red blood cell level--unusual weakness or fatigue, dizziness, headache, trouble breathing Painful swelling, warmth, or redness of the skin, blisters or sores at the infusion site Pain, tingling, or numbness in the hands or feet Slow heartbeat--dizziness, feeling faint or lightheaded, confusion, trouble breathing, unusual weakness or fatigue Unusual bruising or bleeding Side effects that usually do not require medical attention (report to your care team if they continue or are bothersome): Diarrhea Hair loss Joint pain Loss of appetite Muscle  pain Nausea Vomiting This list may not describe all possible side effects. Call your doctor for medical advice about side effects. You may report side effects to FDA at 1-800-FDA-1088. Where should I keep my medication? This medication is given in a hospital or clinic. It will not be stored at home. NOTE: This sheet is a summary. It may not cover all possible information. If you have questions about this medicine, talk to your doctor, pharmacist, or health care provider.  2023 Elsevier/Gold Standard (2022-03-20 00:00:00) Carboplatin Injection What is this medication? CARBOPLATIN (KAR boe pla tin) treats some types of cancer. It works by slowing down the growth of cancer cells. This medicine may be used for other purposes; ask your health care provider or pharmacist if you have questions. COMMON BRAND NAME(S): Paraplatin What should I tell my care team before I take this medication? They need to know if you have any of these conditions: Blood disorders Hearing problems Kidney disease Recent or ongoing radiation therapy An unusual or allergic reaction to carboplatin, cisplatin, other medications, foods, dyes, or preservatives Pregnant or trying to get pregnant Breast-feeding How should I use this medication? This medication is injected into a vein. It is given by your care team in a hospital or clinic setting. Talk to your care team about the use of this medication in children. Special care may be needed. Overdosage: If you think you have taken too much of this medicine contact a poison control center or emergency room at once. NOTE: This medicine is only for you. Do not share this medicine with others. What if I miss a dose? Keep appointments for follow-up doses. It is important not to miss your dose. Call your care team if you are unable to keep an appointment. What may interact with this medication? Medications for seizures Some antibiotics, such as amikacin, gentamicin, neomycin,  streptomycin, tobramycin Vaccines This list may not describe all possible interactions. Give your health care provider a list of all the medicines, herbs, non-prescription drugs, or dietary supplements you use. Also tell them if you smoke, drink alcohol, or use illegal drugs. Some items may interact with your medicine. What should I watch for while using this medication? Your condition will be monitored carefully while you are receiving this medication. You may need blood work while taking this medication. This medication may make you feel generally unwell. This is not uncommon, as chemotherapy can affect healthy cells as well as cancer cells. Report any side effects. Continue your course of treatment even though you feel ill unless your care team tells you to stop. In some cases, you may be given additional medications to help with side effects. Follow all directions for their use. This medication may increase your risk of getting an infection. Call your care team for advice if you get a fever, chills, sore throat, or other symptoms of a cold or flu. Do not treat yourself. Try to avoid being around people who are sick. Avoid taking  medications that contain aspirin, acetaminophen, ibuprofen, naproxen, or ketoprofen unless instructed by your care team. These medications may hide a fever. Be careful brushing or flossing your teeth or using a toothpick because you may get an infection or bleed more easily. If you have any dental work done, tell your dentist you are receiving this medication. Talk to your care team if you wish to become pregnant or think you might be pregnant. This medication can cause serious birth defects. Talk to your care team about effective forms of contraception. Do not breast-feed while taking this medication. What side effects may I notice from receiving this medication? Side effects that you should report to your care team as soon as possible: Allergic reactions--skin rash,  itching, hives, swelling of the face, lips, tongue, or throat Infection--fever, chills, cough, sore throat, wounds that don't heal, pain or trouble when passing urine, general feeling of discomfort or being unwell Low red blood cell level--unusual weakness or fatigue, dizziness, headache, trouble breathing Pain, tingling, or numbness in the hands or feet, muscle weakness, change in vision, confusion or trouble speaking, loss of balance or coordination, trouble walking, seizures Unusual bruising or bleeding Side effects that usually do not require medical attention (report to your care team if they continue or are bothersome): Hair loss Nausea Unusual weakness or fatigue Vomiting This list may not describe all possible side effects. Call your doctor for medical advice about side effects. You may report side effects to FDA at 1-800-FDA-1088. Where should I keep my medication? This medication is given in a hospital or clinic. It will not be stored at home. NOTE: This sheet is a summary. It may not cover all possible information. If you have questions about this medicine, talk to your doctor, pharmacist, or health care provider.  2023 Elsevier/Gold Standard (2022-02-25 00:00:00)

## 2022-09-03 ENCOUNTER — Encounter: Payer: Self-pay | Admitting: Oncology

## 2022-09-03 ENCOUNTER — Telehealth: Payer: Self-pay

## 2022-09-03 NOTE — Telephone Encounter (Addendum)
I spoke with Allen [name] this afternoon. He did pretty well after his first infusion. He was in clinic today to see Dr Bobby Rumpf as well. No N/V, constipation/diarrhea, skin rashes, itching and fevers. Allen [name] reminded to call us if the develops temp of 100.4 or higher, day or night. He verbalized understanding.

## 2022-09-04 NOTE — Progress Notes (Signed)
[facility]  [address] [place],  [place]  [address] [phone]  Clinic Day:  09/05/2022  Referring physician: Marice Potter, MD   HISTORY OF PRESENT ILLNESS:  The patient is a 71 y.o. 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  This patient is based upon his PET scan showing a right paratracheal lymph node, which is consistent with contralateral nodal involvement. He is in the middle of his 1st week of chemoradiation, which consists of weekly carboplatin/paclitaxel.  Thus far, he has tolerated his treatments well.    PHYSICAL EXAM:  Blood pressure (!) 105/56, pulse (!) 131, temperature 98.3 F (36.8 C), resp. rate (!) 22, height 6\' 3"  (1.905 m), weight 234 lb 11.2 oz (106.5 kg), SpO2 91 %. Wt Readings from Last 3 Encounters:  09/05/22 234 lb 11.2 oz (106.5 kg)  09/02/22 235 lb 1.9 oz (106.6 kg)  08/29/22 231 lb 3.2 oz (104.9 kg)   Body mass index is 29.34 kg/m. Performance status (ECOG): 1 - Symptomatic but completely ambulatory Physical Exam Constitutional:      Appearance: Normal appearance. He is not ill-appearing.  HENT:     Mouth/Throat:     Mouth: Mucous membranes are moist.     Pharynx: Oropharynx is clear. No oropharyngeal exudate or posterior oropharyngeal erythema.  Cardiovascular:     Rate and Rhythm: Normal rate and regular rhythm.     Heart sounds: No murmur heard.    No friction rub. No gallop.  Pulmonary:     Effort: Pulmonary effort is normal. No respiratory distress.     Breath sounds: Examination of the left-upper field reveals rhonchi. Examination of the left-middle field reveals rhonchi. Examination of the left-lower field reveals rhonchi. Rhonchi present. No wheezing or rales.  Abdominal:     General: Bowel sounds are normal. There is no distension.     Palpations: Abdomen is soft. There is no mass.     Tenderness: There is no abdominal tenderness.  Musculoskeletal:         General: No swelling.     Right lower leg: No edema.     Left lower leg: No edema.  Lymphadenopathy:     Cervical: No cervical adenopathy.     Upper Body:     Right upper body: No supraclavicular or axillary adenopathy.     Left upper body: No supraclavicular or axillary adenopathy.     Lower Body: No right inguinal adenopathy. No left inguinal adenopathy.  Skin:    General: Skin is warm.     Coloration: Skin is not jaundiced.     Findings: No lesion or rash.  Neurological:     General: No focal deficit present.     Mental Status: He is alert and oriented to person, place, and time. Mental status is at baseline.  Psychiatric:        Mood and Affect: Mood normal.        Behavior: Behavior normal.        Thought Content: Thought content normal.    LABS:      Latest Ref Rng & Units 09/05/2022   12:00 AM 08/29/2022   12:00 AM 07/08/2022    2:00 PM  CBC  WBC  8.6     7.7     9.3   Hemoglobin 13.5 - 17.5 16.6     16.7     16.5   Hematocrit 41 - 53 51  51     51.0   Platelets 150 - 400 K/uL 196     197     215      This result is from an external source.      Latest Ref Rng & Units 09/05/2022   12:00 AM 08/29/2022   12:00 AM 07/08/2022    2:00 PM  CMP  Glucose 70 - 99 mg/dL   227   BUN 4 - 21 20     20     29    Creatinine 0.6 - 1.3 1.0     1.0     1.54   Sodium 137 - 147 137     136     136   Potassium 3.5 - 5.1 mEq/L 4.6     4.1     4.2   Chloride 99 - 108 102     101     101   CO2 13 - 22 28     28     23    Calcium 8.7 - 10.7 9.5     9.3     8.8   Total Protein 6.5 - 8.1 g/dL   6.7   Total Bilirubin 0.3 - 1.2 mg/dL   0.4   Alkaline Phos 25 - 125 61     66     54   AST 14 - 40 26     23     13    ALT 10 - 40 U/L 27     23     20       This result is from an external source.    ASSESSMENT & PLAN:  A 71 y.o. 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  This patient will continue receiving concurrent chemoradiation, consisting of weekly  carboplatin/paclitaxel.  Clinically, he appears to be tolerating both modalities very well.  I will see him back in 3 weeks for repeat clinical assessment.  The patient understands all the plans discussed today and knows to contact our office if he runs into any problems that require immediate clinical attention.    Allen Longanecker Macarthur Critchley, MD

## 2022-09-05 ENCOUNTER — Encounter: Payer: Self-pay | Admitting: Oncology

## 2022-09-05 ENCOUNTER — Inpatient Hospital Stay (INDEPENDENT_AMBULATORY_CARE_PROVIDER_SITE_OTHER): Payer: Medicare Other | Admitting: Oncology

## 2022-09-05 ENCOUNTER — Inpatient Hospital Stay: Payer: Medicare Other

## 2022-09-05 DIAGNOSIS — C3412 Malignant neoplasm of upper lobe, left bronchus or lung: Secondary | ICD-10-CM | POA: Diagnosis not present

## 2022-09-05 LAB — BASIC METABOLIC PANEL
BUN: 20 (ref 4–21)
CO2: 28 — AB (ref 13–22)
Chloride: 102 (ref 99–108)
Creatinine: 1 (ref 0.6–1.3)
Glucose: 99
Potassium: 4.6 mEq/L (ref 3.5–5.1)
Sodium: 137 (ref 137–147)

## 2022-09-05 LAB — HEPATIC FUNCTION PANEL
ALT: 27 U/L (ref 10–40)
AST: 26 (ref 14–40)
Alkaline Phosphatase: 61 (ref 25–125)
Bilirubin, Total: 0.8

## 2022-09-05 LAB — COMPREHENSIVE METABOLIC PANEL
Albumin: 4.3 (ref 3.5–5.0)
Calcium: 9.5 (ref 8.7–10.7)

## 2022-09-05 LAB — CBC AND DIFFERENTIAL
HCT: 51 (ref 41–53)
Hemoglobin: 16.6 (ref 13.5–17.5)
Neutrophils Absolute: 7.65
Platelets: 196 10*3/uL (ref 150–400)
WBC: 8.6

## 2022-09-05 LAB — CBC: RBC: 5.65 — AB (ref 3.87–5.11)

## 2022-09-09 ENCOUNTER — Inpatient Hospital Stay: Payer: Medicare Other

## 2022-09-09 VITALS — BP 110/61 | HR 103 | Temp 98.1°F | Resp 21

## 2022-09-09 DIAGNOSIS — C3412 Malignant neoplasm of upper lobe, left bronchus or lung: Secondary | ICD-10-CM

## 2022-09-09 DIAGNOSIS — Z5111 Encounter for antineoplastic chemotherapy: Secondary | ICD-10-CM | POA: Diagnosis not present

## 2022-09-09 MED ORDER — DIPHENHYDRAMINE HCL 50 MG/ML IJ SOLN
50.0000 mg | Freq: Once | INTRAMUSCULAR | Status: AC
  Administered 2022-09-09: 50 mg via INTRAVENOUS
  Filled 2022-09-09: qty 1

## 2022-09-09 MED ORDER — HEPARIN SOD (PORK) LOCK FLUSH 100 UNIT/ML IV SOLN
500.0000 [IU] | Freq: Once | INTRAVENOUS | Status: AC | PRN
  Administered 2022-09-09: 500 [IU]

## 2022-09-09 MED ORDER — SODIUM CHLORIDE 0.9% FLUSH
10.0000 mL | INTRAVENOUS | Status: DC | PRN
  Administered 2022-09-09: 10 mL

## 2022-09-09 MED ORDER — SODIUM CHLORIDE 0.9 % IV SOLN: Freq: Once | INTRAVENOUS | Status: AC

## 2022-09-09 MED ORDER — SODIUM CHLORIDE 0.9 % IV SOLN
45.0000 mg/m2 | Freq: Once | INTRAVENOUS | Status: AC
  Administered 2022-09-09: 108 mg via INTRAVENOUS
  Filled 2022-09-09: qty 18

## 2022-09-09 MED ORDER — PALONOSETRON HCL INJECTION 0.25 MG/5ML
0.2500 mg | Freq: Once | INTRAVENOUS | Status: AC
  Administered 2022-09-09: 0.25 mg via INTRAVENOUS
  Filled 2022-09-09: qty 5

## 2022-09-09 MED ORDER — SODIUM CHLORIDE 0.9 % IV SOLN
234.0000 mg | Freq: Once | INTRAVENOUS | Status: AC
  Administered 2022-09-09: 230 mg via INTRAVENOUS
  Filled 2022-09-09: qty 23

## 2022-09-09 MED ORDER — SODIUM CHLORIDE 0.9 % IV SOLN
10.0000 mg | Freq: Once | INTRAVENOUS | Status: AC
  Administered 2022-09-09: 10 mg via INTRAVENOUS
  Filled 2022-09-09: qty 10

## 2022-09-09 MED ORDER — FAMOTIDINE IN NACL 20-0.9 MG/50ML-% IV SOLN
20.0000 mg | Freq: Once | INTRAVENOUS | Status: AC
  Administered 2022-09-09: 20 mg via INTRAVENOUS
  Filled 2022-09-09: qty 50

## 2022-09-12 ENCOUNTER — Ambulatory Visit: Payer: Medicare Other | Admitting: Oncology

## 2022-09-12 ENCOUNTER — Other Ambulatory Visit: Payer: Medicare Other

## 2022-09-14 ENCOUNTER — Encounter: Payer: Self-pay | Admitting: Oncology

## 2022-09-15 ENCOUNTER — Ambulatory Visit: Payer: Medicare Other | Admitting: Oncology

## 2022-09-15 ENCOUNTER — Inpatient Hospital Stay: Payer: Medicare Other

## 2022-09-15 ENCOUNTER — Other Ambulatory Visit: Payer: Medicare Other

## 2022-09-15 DIAGNOSIS — C3412 Malignant neoplasm of upper lobe, left bronchus or lung: Secondary | ICD-10-CM

## 2022-09-15 LAB — BASIC METABOLIC PANEL
BUN: 29 — AB (ref 4–21)
CO2: 25 — AB (ref 13–22)
Chloride: 99 (ref 99–108)
Creatinine: 1.2 (ref 0.6–1.3)
Glucose: 222
Potassium: 4.6 mEq/L (ref 3.5–5.1)
Sodium: 133 — AB (ref 137–147)

## 2022-09-15 LAB — CBC AND DIFFERENTIAL
HCT: 48 (ref 41–53)
Hemoglobin: 15.9 (ref 13.5–17.5)
Neutrophils Absolute: 5.49
Platelets: 200 10*3/uL (ref 150–400)
WBC: 6.1

## 2022-09-15 LAB — HEPATIC FUNCTION PANEL
ALT: 26 U/L (ref 10–40)
AST: 20 (ref 14–40)
Alkaline Phosphatase: 56 (ref 25–125)
Bilirubin, Total: 0.6

## 2022-09-15 LAB — CBC: RBC: 5.37 — AB (ref 3.87–5.11)

## 2022-09-15 LAB — COMPREHENSIVE METABOLIC PANEL
Albumin: 4.2 (ref 3.5–5.0)
Calcium: 9.3 (ref 8.7–10.7)

## 2022-09-15 MED FILL — Paclitaxel IV Conc 300 MG/50ML (6 MG/ML): INTRAVENOUS | Qty: 18 | Status: AC

## 2022-09-15 MED FILL — Dexamethasone Sodium Phosphate Inj 100 MG/10ML: INTRAMUSCULAR | Qty: 1 | Status: AC

## 2022-09-16 ENCOUNTER — Inpatient Hospital Stay: Payer: Medicare Other

## 2022-09-16 VITALS — BP 106/62 | HR 106 | Temp 97.5°F | Resp 21 | Ht 75.0 in | Wt 230.0 lb

## 2022-09-16 DIAGNOSIS — C3412 Malignant neoplasm of upper lobe, left bronchus or lung: Secondary | ICD-10-CM

## 2022-09-16 DIAGNOSIS — Z5111 Encounter for antineoplastic chemotherapy: Secondary | ICD-10-CM | POA: Diagnosis not present

## 2022-09-16 MED ORDER — HEPARIN SOD (PORK) LOCK FLUSH 100 UNIT/ML IV SOLN
500.0000 [IU] | Freq: Once | INTRAVENOUS | Status: AC | PRN
  Administered 2022-09-16: 500 [IU]

## 2022-09-16 MED ORDER — SODIUM CHLORIDE 0.9 % IV SOLN
10.0000 mg | Freq: Once | INTRAVENOUS | Status: AC
  Administered 2022-09-16: 10 mg via INTRAVENOUS
  Filled 2022-09-16: qty 10

## 2022-09-16 MED ORDER — PALONOSETRON HCL INJECTION 0.25 MG/5ML
0.2500 mg | Freq: Once | INTRAVENOUS | Status: AC
  Administered 2022-09-16: 0.25 mg via INTRAVENOUS
  Filled 2022-09-16: qty 5

## 2022-09-16 MED ORDER — SODIUM CHLORIDE 0.9% FLUSH
10.0000 mL | INTRAVENOUS | Status: DC | PRN
  Administered 2022-09-16: 10 mL

## 2022-09-16 MED ORDER — SODIUM CHLORIDE 0.9 % IV SOLN
234.0000 mg | Freq: Once | INTRAVENOUS | Status: AC
  Administered 2022-09-16: 230 mg via INTRAVENOUS
  Filled 2022-09-16: qty 23

## 2022-09-16 MED ORDER — SODIUM CHLORIDE 0.9 % IV SOLN: Freq: Once | INTRAVENOUS | Status: AC

## 2022-09-16 MED ORDER — DIPHENHYDRAMINE HCL 50 MG/ML IJ SOLN
50.0000 mg | Freq: Once | INTRAMUSCULAR | Status: AC
  Administered 2022-09-16: 50 mg via INTRAVENOUS
  Filled 2022-09-16: qty 1

## 2022-09-16 MED ORDER — FAMOTIDINE IN NACL 20-0.9 MG/50ML-% IV SOLN
20.0000 mg | Freq: Once | INTRAVENOUS | Status: AC
  Administered 2022-09-16: 20 mg via INTRAVENOUS
  Filled 2022-09-16: qty 50

## 2022-09-16 MED ORDER — SODIUM CHLORIDE 0.9 % IV SOLN
45.0000 mg/m2 | Freq: Once | INTRAVENOUS | Status: AC
  Administered 2022-09-16: 108 mg via INTRAVENOUS
  Filled 2022-09-16: qty 18

## 2022-09-16 NOTE — Patient Instructions (Signed)
Carboplatin Injection What is this medication? CARBOPLATIN (KAR boe pla tin) treats some types of cancer. It works by slowing down the growth of cancer cells. This medicine may be used for other purposes; ask your health care provider or pharmacist if you have questions. COMMON BRAND NAME(S): Paraplatin What should I tell my care team before I take this medication? They need to know if you have any of these conditions: Blood disorders Hearing problems Kidney disease Recent or ongoing radiation therapy An unusual or allergic reaction to carboplatin, cisplatin, other medications, foods, dyes, or preservatives Pregnant or trying to get pregnant Breast-feeding How should I use this medication? This medication is injected into a vein. It is given by your care team in a hospital or clinic setting. Talk to your care team about the use of this medication in children. Special care may be needed. Overdosage: If you think you have taken too much of this medicine contact a poison control center or emergency room at once. NOTE: This medicine is only for you. Do not share this medicine with others. What if I miss a dose? Keep appointments for follow-up doses. It is important not to miss your dose. Call your care team if you are unable to keep an appointment. What may interact with this medication? Medications for seizures Some antibiotics, such as amikacin, gentamicin, neomycin, streptomycin, tobramycin Vaccines This list may not describe all possible interactions. Give your health care provider a list of all the medicines, herbs, non-prescription drugs, or dietary supplements you use. Also tell them if you smoke, drink alcohol, or use illegal drugs. Some items may interact with your medicine. What should I watch for while using this medication? Your condition will be monitored carefully while you are receiving this medication. You may need blood work while taking this medication. This medication may  make you feel generally unwell. This is not uncommon, as chemotherapy can affect healthy cells as well as cancer cells. Report any side effects. Continue your course of treatment even though you feel ill unless your care team tells you to stop. In some cases, you may be given additional medications to help with side effects. Follow all directions for their use. This medication may increase your risk of getting an infection. Call your care team for advice if you get a fever, chills, sore throat, or other symptoms of a cold or flu. Do not treat yourself. Try to avoid being around people who are sick. Avoid taking medications that contain aspirin, acetaminophen, ibuprofen, naproxen, or ketoprofen unless instructed by your care team. These medications may hide a fever. Be careful brushing or flossing your teeth or using a toothpick because you may get an infection or bleed more easily. If you have any dental work done, tell your dentist you are receiving this medication. Talk to your care team if you wish to become pregnant or think you might be pregnant. This medication can cause serious birth defects. Talk to your care team about effective forms of contraception. Do not breast-feed while taking this medication. What side effects may I notice from receiving this medication? Side effects that you should report to your care team as soon as possible: Allergic reactions--skin rash, itching, hives, swelling of the face, lips, tongue, or throat Infection--fever, chills, cough, sore throat, wounds that don't heal, pain or trouble when passing urine, general feeling of discomfort or being unwell Low red blood cell level--unusual weakness or fatigue, dizziness, headache, trouble breathing Pain, tingling, or numbness in the hands or  feet, muscle weakness, change in vision, confusion or trouble speaking, loss of balance or coordination, trouble walking, seizures Unusual bruising or bleeding Side effects that usually  do not require medical attention (report to your care team if they continue or are bothersome): Hair loss Nausea Unusual weakness or fatigue Vomiting This list may not describe all possible side effects. Call your doctor for medical advice about side effects. You may report side effects to FDA at 1-800-FDA-1088. Where should I keep my medication? This medication is given in a hospital or clinic. It will not be stored at home. NOTE: This sheet is a summary. It may not cover all possible information. If you have questions about this medicine, talk to your doctor, pharmacist, or health care provider.  2023 Elsevier/Gold Standard (2022-02-17 00:00:00) Paclitaxel Injection What is this medication? PACLITAXEL (PAK li TAX el) treats some types of cancer. It works by slowing down the growth of cancer cells. This medicine may be used for other purposes; ask your health care provider or pharmacist if you have questions. COMMON BRAND NAME(S): Onxol, Taxol What should I tell my care team before I take this medication? They need to know if you have any of these conditions: Heart disease Liver disease Low white blood cell levels An unusual or allergic reaction to paclitaxel, other medications, foods, dyes, or preservatives If you or your partner are pregnant or trying to get pregnant Breast-feeding How should I use this medication? This medication is injected into a vein. It is given by your care team in a hospital or clinic setting. Talk to your care team about the use of this medication in children. While it may be given to children for selected conditions, precautions do apply. Overdosage: If you think you have taken too much of this medicine contact a poison control center or emergency room at once. NOTE: This medicine is only for you. Do not share this medicine with others. What if I miss a dose? Keep appointments for follow-up doses. It is important not to miss your dose. Call your care team if  you are unable to keep an appointment. What may interact with this medication? Do not take this medication with any of the following: Live virus vaccines Other medications may affect the way this medication works. Talk with your care team about all of the medications you take. They may suggest changes to your treatment plan to lower the risk of side effects and to make sure your medications work as intended. This list may not describe all possible interactions. Give your health care provider a list of all the medicines, herbs, non-prescription drugs, or dietary supplements you use. Also tell them if you smoke, drink alcohol, or use illegal drugs. Some items may interact with your medicine. What should I watch for while using this medication? Your condition will be monitored carefully while you are receiving this medication. You may need blood work while taking this medication. This medication may make you feel generally unwell. This is not uncommon as chemotherapy can affect healthy cells as well as cancer cells. Report any side effects. Continue your course of treatment even though you feel ill unless your care team tells you to stop. This medication can cause serious allergic reactions. To reduce the risk, your care team may give you other medications to take before receiving this one. Be sure to follow the directions from your care team. This medication may increase your risk of getting an infection. Call your care team for advice if  you get a fever, chills, sore throat, or other symptoms of a cold or flu. Do not treat yourself. Try to avoid being around people who are sick. This medication may increase your risk to bruise or bleed. Call your care team if you notice any unusual bleeding. Be careful brushing or flossing your teeth or using a toothpick because you may get an infection or bleed more easily. If you have any dental work done, tell your dentist you are receiving this medication. Talk to  your care team if you may be pregnant. Serious birth defects can occur if you take this medication during pregnancy. Talk to your care team before breastfeeding. Changes to your treatment plan may be needed. What side effects may I notice from receiving this medication? Side effects that you should report to your care team as soon as possible: Allergic reactions--skin rash, itching, hives, swelling of the face, lips, tongue, or throat Heart rhythm changes--fast or irregular heartbeat, dizziness, feeling faint or lightheaded, chest pain, trouble breathing Increase in blood pressure Infection--fever, chills, cough, sore throat, wounds that don't heal, pain or trouble when passing urine, general feeling of discomfort or being unwell Low blood pressure--dizziness, feeling faint or lightheaded, blurry vision Low red blood cell level--unusual weakness or fatigue, dizziness, headache, trouble breathing Painful swelling, warmth, or redness of the skin, blisters or sores at the infusion site Pain, tingling, or numbness in the hands or feet Slow heartbeat--dizziness, feeling faint or lightheaded, confusion, trouble breathing, unusual weakness or fatigue Unusual bruising or bleeding Side effects that usually do not require medical attention (report to your care team if they continue or are bothersome): Diarrhea Hair loss Joint pain Loss of appetite Muscle pain Nausea Vomiting This list may not describe all possible side effects. Call your doctor for medical advice about side effects. You may report side effects to FDA at 1-800-FDA-1088. Where should I keep my medication? This medication is given in a hospital or clinic. It will not be stored at home. NOTE: This sheet is a summary. It may not cover all possible information. If you have questions about this medicine, talk to your doctor, pharmacist, or health care provider.  2023 Elsevier/Gold Standard (2022-03-05 00:00:00)

## 2022-09-18 ENCOUNTER — Encounter: Payer: Self-pay | Admitting: Oncology

## 2022-09-18 IMAGING — DX DG CHEST 1V PORT
1 series · 1 of 1 positions shown · non-contrast
Comparison: December 01, 2020.

CLINICAL DATA: Shortness of breath, PHOVU-24 positive.

EXAM:
PORTABLE CHEST 1 VIEW

[chest]
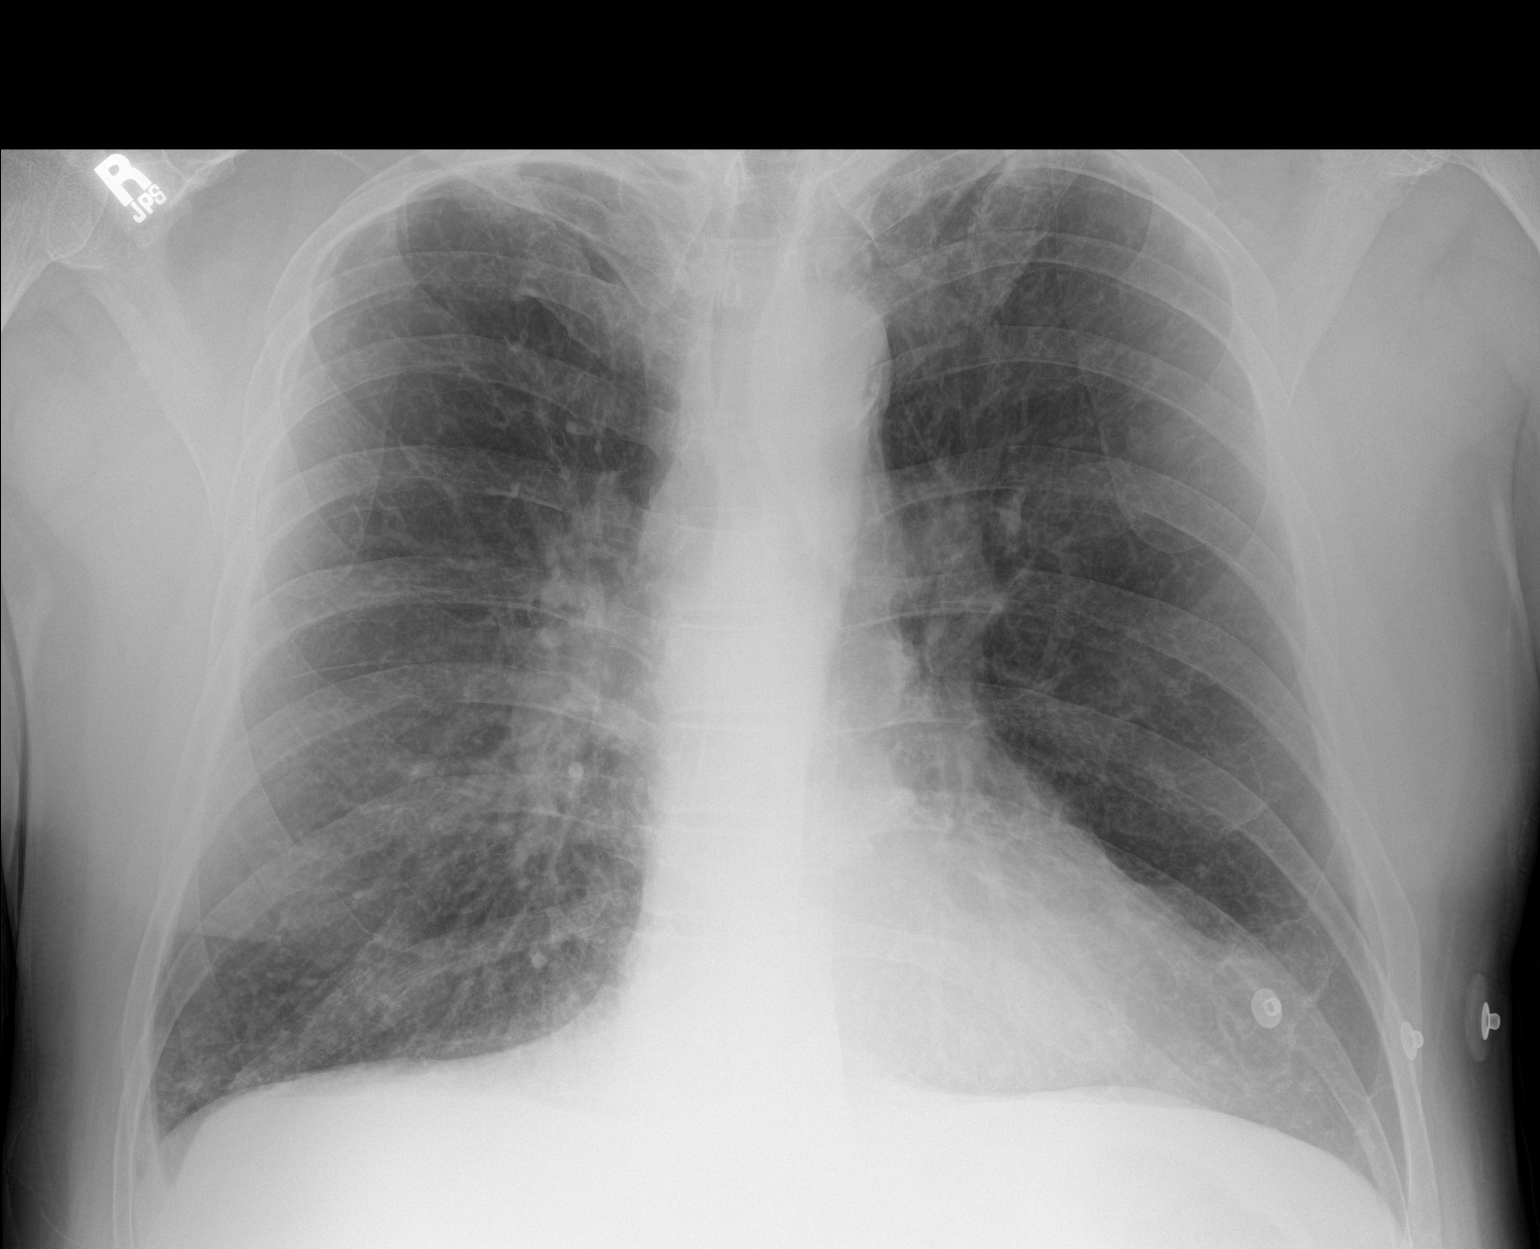

[1 of 1 positions shown; findings below may reference images not displayed]

FINDINGS: The heart size and mediastinal contours are within normal limits.
Both lungs are clear. No pneumothorax or pleural effusion is noted.
The visualized skeletal structures are unremarkable.
IMPRESSION: No active disease.

## 2022-09-18 IMAGING — CT CT ANGIO CHEST
2 of 6 series · 17 of 46 positions shown · IV contrast (omnipaque)
Comparison: Chest x-ray 12/04/2019, CT angiography chest
12/01/2020, chest x-ray 07/12/05

CLINICAL DATA: Positive COVID 12/01/2020, increased shortness of
breath and chest discomfort. Saturation 87% on room air.

EXAM:
CT ANGIOGRAPHY CHEST WITH CONTRAST
TECHNIQUE: Multidetector CT imaging of the chest was performed using the
standard protocol during bolus administration of intravenous
contrast. Multiplanar CT image reconstructions and MIPs were
obtained to evaluate the vascular anatomy.
CONTRAST:  75mL OMNIPAQUE IOHEXOL 350 MG/ML SOLN

[Series 8: thins · axial · 0.96mm/px · z∈[+1153,+1406]mm · 14 of 279 slices shown]
[im 13/279  lung]
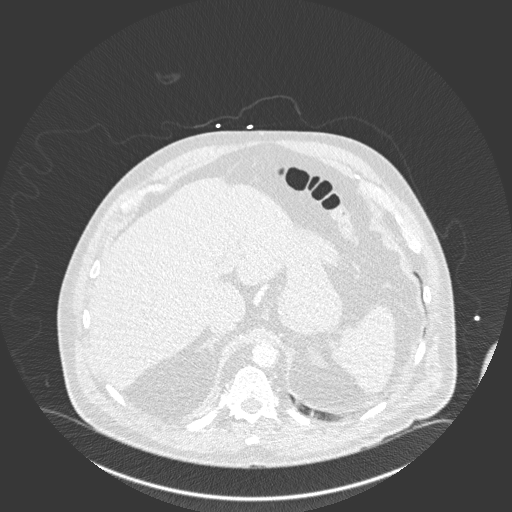
[im 37/279  soft-tissue]
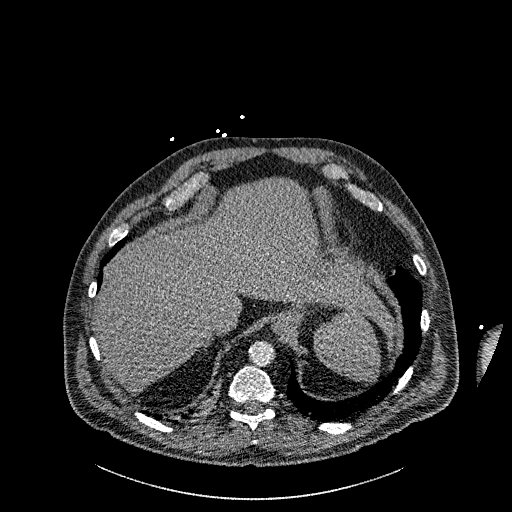
[im 49/279  lung]
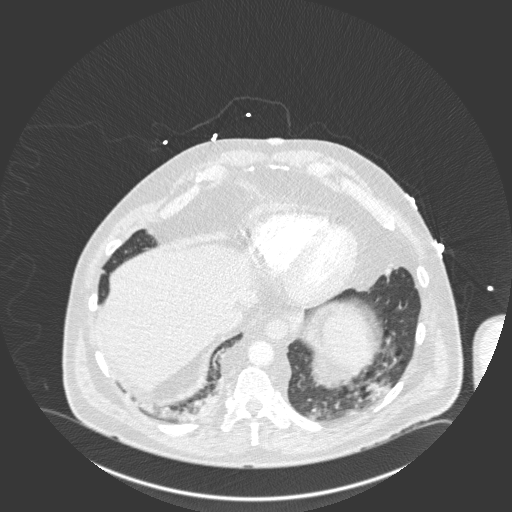
[im 73/279  soft-tissue]
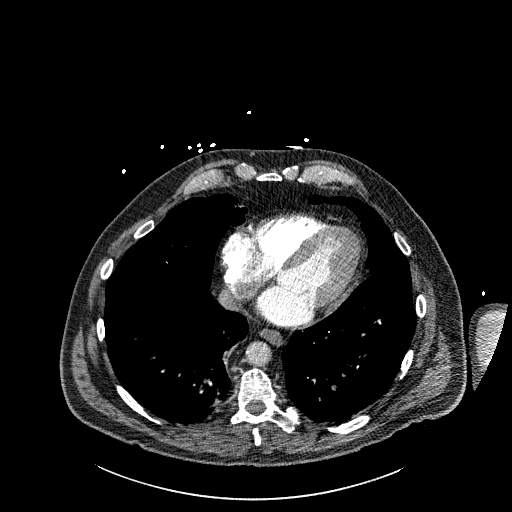
[im 97/279  lung]
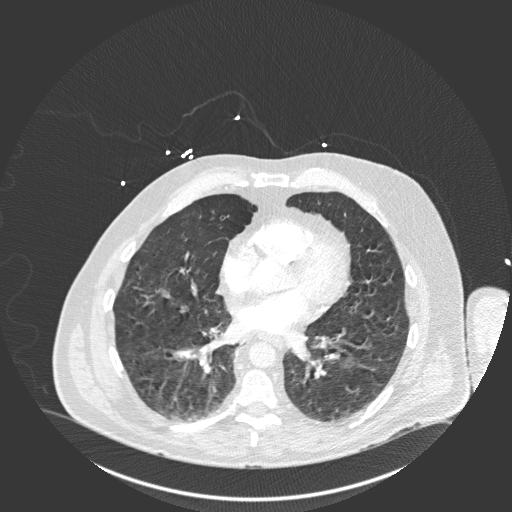
[im 109/279  soft-tissue]
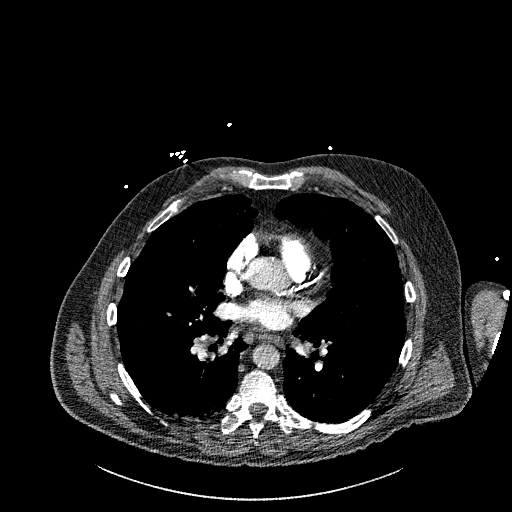
[im 133/279  lung]
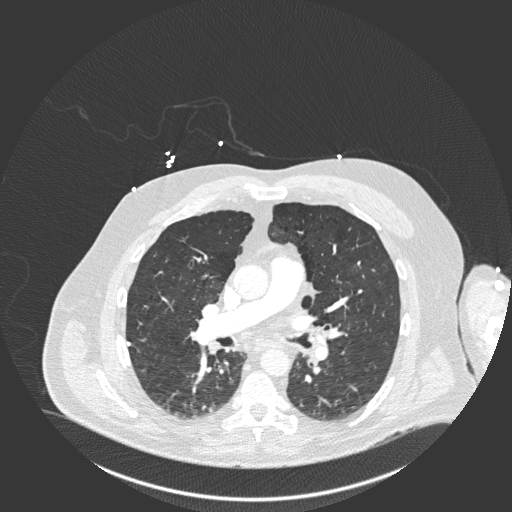
[im 146/279  soft-tissue]
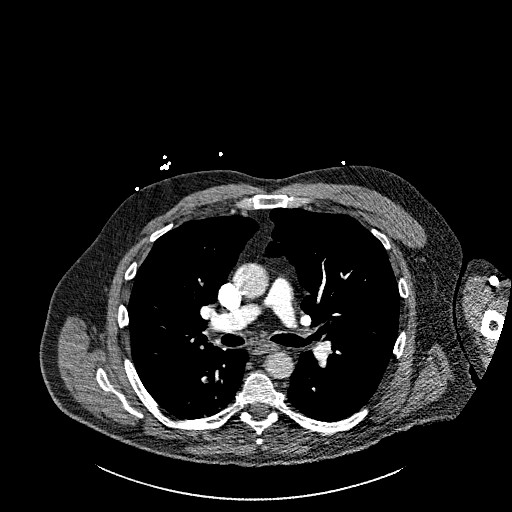
[im 170/279  lung]
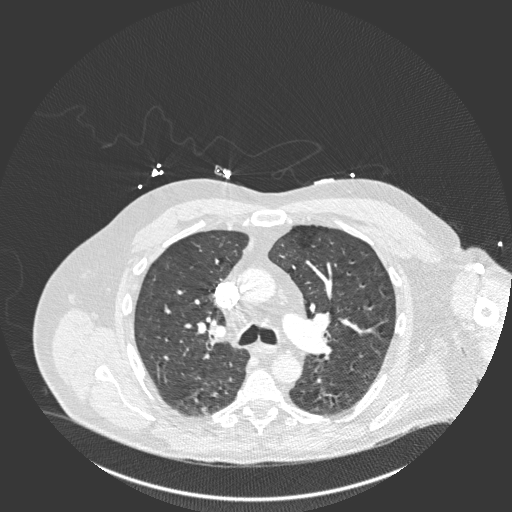
[im 182/279  soft-tissue]
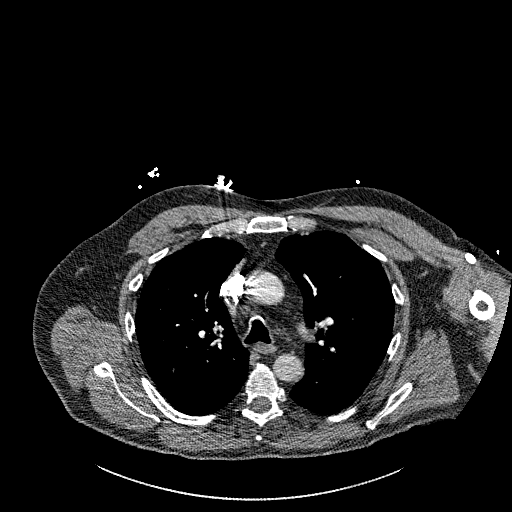
[im 206/279  lung]
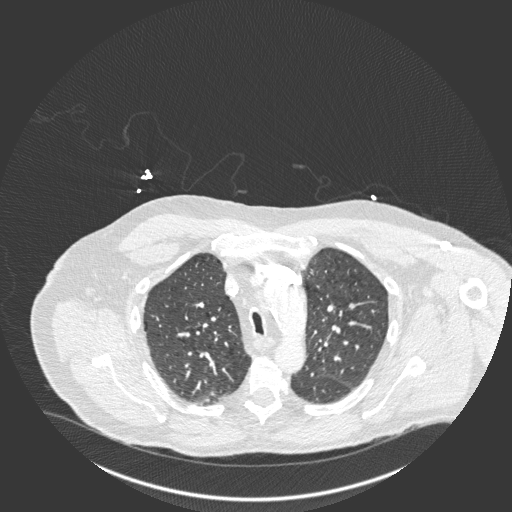
[im 230/279  soft-tissue]
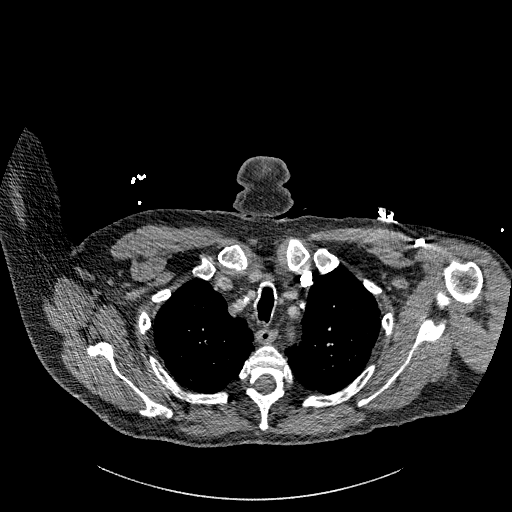
[im 242/279  lung]
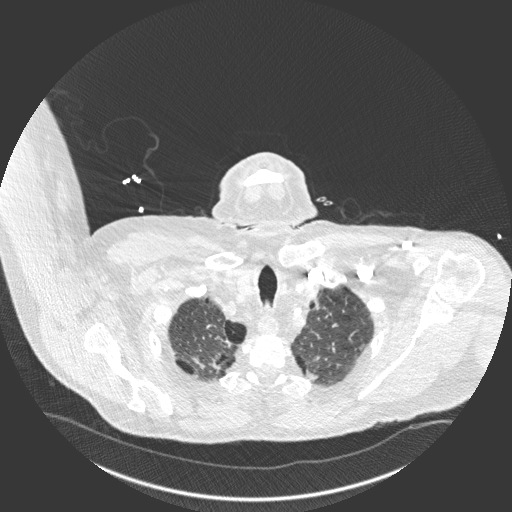
[im 266/279  soft-tissue]
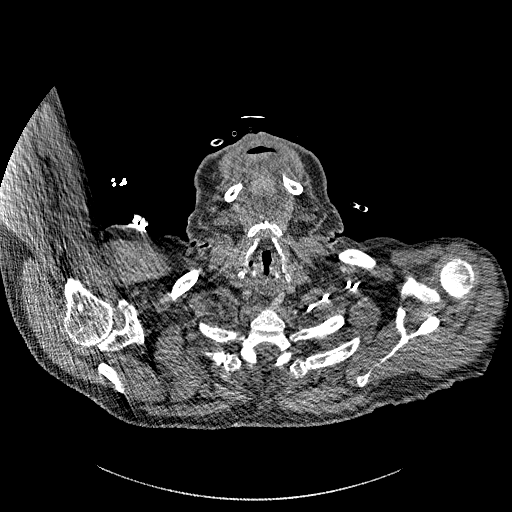

[Series 10: coronal mpr · coronal · 0.55mm/px · 3 of 168 slices shown]
[im 42/168  soft-tissue]
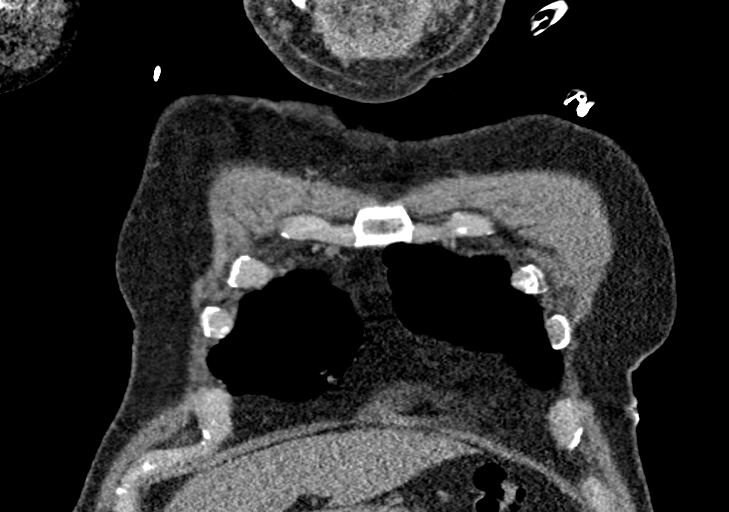
[im 84/168  soft-tissue]
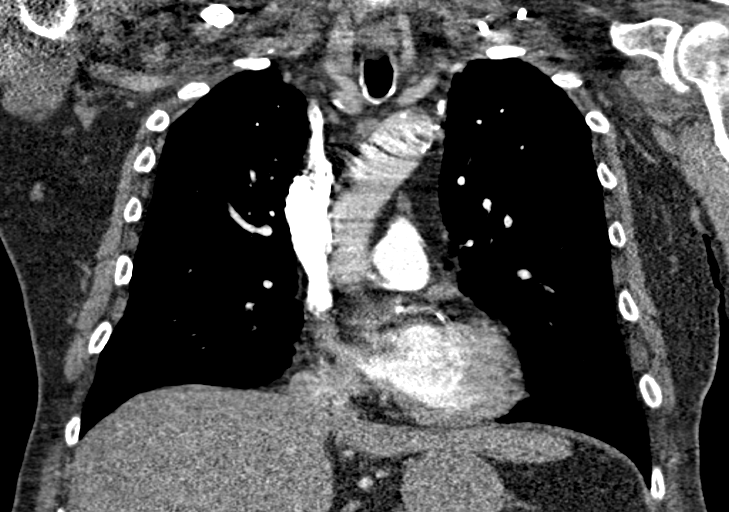
[im 126/168  soft-tissue]
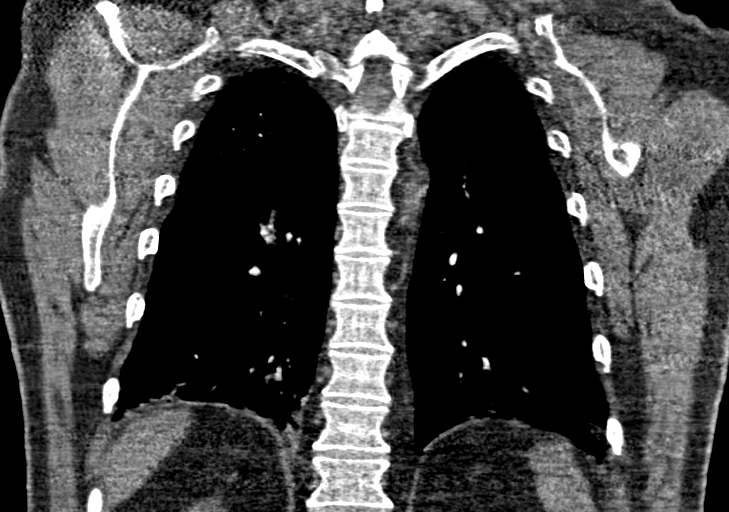

[17 of 46 positions shown; findings below may reference images not displayed]

FINDINGS: Cardiovascular: Satisfactory opacification of the pulmonary arteries
to the segmental level. Limited evaluation of the subsegmental level
due to motion artifact. No pulmonary embolus. Normal heart size. No
significant pericardial effusion. The thoracic aorta is normal in
caliber. Mild atherosclerotic plaque of the thoracic aorta. Mild
four-vessel coronary artery calcifications.

Mediastinum/Nodes: Stable 1.1 cm right interlobar ([DATE]) and stable
1.1 cm paraesophageal ([DATE]) lymph nodes. No enlarged left hilar or
axillary lymph nodes. Thyroid gland, trachea, and esophagus
demonstrate no significant findings.

Lungs/Pleura: Mild to moderate paraseptal emphysematous changes.
Interval increase in patchy scattered bilateral lower lobe
opacities. Redemonstration of a left lower lobe pulmonary nodule
measuring 5 mm ([DATE]) .A 1.2 cm nodule within the left upper lobe is
stable ([DATE]). There is subjacent redemonstration of nodular-like
1.4 cm density in the left upper lobe ([DATE]). Adjacent 6 mm nodule
([DATE]). Few other scattered subpleural micronodules. Calcified
granuloma within the right upper lobe ([DATE]).

Upper Abdomen: No acute abnormality.

Musculoskeletal: No chest wall abnormality. No acute or significant
osseous findings.

Review of the MIP images confirms the above findings.
IMPRESSION: 1. No central or segmental pulmonary embolus.
2. Interval increase in patchy opacities within bilateral lower
lobes likely representing developing [id number] pneumonia.
3. Several nodular like densities measuring up to 1.4 cm within the
left lung. Non-contrast chest CT at 3-6 months is recommended. If
nodules persist, subsequent management will be based upon the most
suspicious nodule(s). This recommendation follows the consensus
statement: Guidelines for Management of Incidental Pulmonary Nodules
Detected on CT Images: From the [HOSPITAL] 1240; Radiology
1240; [DATE].
4. Stable indeterminate right interlobar and mediastinal lymph
nodes.
5. Aortic Atherosclerosis (SS24H-OFV.V) and Emphysema (SS24H-WUW.4).

## 2022-09-22 ENCOUNTER — Encounter: Payer: Self-pay | Admitting: Oncology

## 2022-09-22 ENCOUNTER — Inpatient Hospital Stay: Payer: Medicare Other | Attending: Oncology

## 2022-09-22 ENCOUNTER — Other Ambulatory Visit: Payer: Self-pay | Admitting: Pharmacist

## 2022-09-22 DIAGNOSIS — Z5111 Encounter for antineoplastic chemotherapy: Secondary | ICD-10-CM | POA: Insufficient documentation

## 2022-09-22 DIAGNOSIS — R059 Cough, unspecified: Secondary | ICD-10-CM | POA: Diagnosis not present

## 2022-09-22 DIAGNOSIS — C3412 Malignant neoplasm of upper lobe, left bronchus or lung: Secondary | ICD-10-CM

## 2022-09-22 LAB — CBC WITH DIFFERENTIAL (CANCER CENTER ONLY)
Abs Immature Granulocytes: 0.02 10*3/uL (ref 0.00–0.07)
Basophils Absolute: 0 10*3/uL (ref 0.0–0.1)
Basophils Relative: 0 %
Eosinophils Absolute: 0 10*3/uL (ref 0.0–0.5)
Eosinophils Relative: 0 %
HCT: 49.9 % (ref 39.0–52.0)
Hemoglobin: 16.2 g/dL (ref 13.0–17.0)
Immature Granulocytes: 0 %
Lymphocytes Relative: 7 %
Lymphs Abs: 0.4 10*3/uL — ABNORMAL LOW (ref 0.7–4.0)
MCH: 29.6 pg (ref 26.0–34.0)
MCHC: 32.5 g/dL (ref 30.0–36.0)
MCV: 91.1 fL (ref 80.0–100.0)
Monocytes Absolute: 0.5 10*3/uL (ref 0.1–1.0)
Monocytes Relative: 9 %
Neutro Abs: 4.2 10*3/uL (ref 1.7–7.7)
Neutrophils Relative %: 84 %
Platelet Count: 174 10*3/uL (ref 150–400)
RBC: 5.48 MIL/uL (ref 4.22–5.81)
RDW: 15.3 % (ref 11.5–15.5)
WBC Count: 5.1 10*3/uL (ref 4.0–10.5)
nRBC: 0 % (ref 0.0–0.2)

## 2022-09-22 LAB — CMP (CANCER CENTER ONLY)
ALT: 23 U/L (ref 0–44)
AST: 16 U/L (ref 15–41)
Albumin: 3.9 g/dL (ref 3.5–5.0)
Alkaline Phosphatase: 51 U/L (ref 38–126)
Anion gap: 7 (ref 5–15)
BUN: 26 mg/dL — ABNORMAL HIGH (ref 8–23)
CO2: 27 mmol/L (ref 22–32)
Calcium: 9.1 mg/dL (ref 8.9–10.3)
Chloride: 98 mmol/L (ref 98–111)
Creatinine: 1.38 mg/dL — ABNORMAL HIGH (ref 0.61–1.24)
GFR, Estimated: 55 mL/min — ABNORMAL LOW (ref >60)
Glucose, Bld: 217 mg/dL — ABNORMAL HIGH (ref 70–99)
Potassium: 4.1 mmol/L (ref 3.5–5.1)
Sodium: 132 mmol/L — ABNORMAL LOW (ref 135–145)
Total Bilirubin: 0.7 mg/dL (ref 0.3–1.2)
Total Protein: 7.4 g/dL (ref 6.5–8.1)

## 2022-09-22 MED FILL — Carboplatin IV Soln 450 MG/45ML: INTRAVENOUS | Qty: 23 | Status: AC

## 2022-09-22 MED FILL — Carboplatin IV Soln 450 MG/45ML: INTRAVENOUS | Qty: 20 | Status: AC

## 2022-09-22 MED FILL — Paclitaxel IV Conc 300 MG/50ML (6 MG/ML): INTRAVENOUS | Qty: 18 | Status: AC

## 2022-09-22 MED FILL — Dexamethasone Sodium Phosphate Inj 100 MG/10ML: INTRAMUSCULAR | Qty: 1 | Status: AC

## 2022-09-22 NOTE — Progress Notes (Signed)
Reduce dose of carboplatin for this cycle due to elevated serum creatinine per Dr. Bobby Rumpf. Patient will be given an additional IVF with chemo to help improve hydration status.

## 2022-09-23 ENCOUNTER — Inpatient Hospital Stay: Payer: Medicare Other

## 2022-09-23 VITALS — BP 105/61 | HR 130 | Temp 97.4°F | Resp 21 | Ht 72.7 in | Wt 226.8 lb

## 2022-09-23 DIAGNOSIS — C3412 Malignant neoplasm of upper lobe, left bronchus or lung: Secondary | ICD-10-CM

## 2022-09-23 DIAGNOSIS — Z5111 Encounter for antineoplastic chemotherapy: Secondary | ICD-10-CM | POA: Diagnosis not present

## 2022-09-23 MED ORDER — SODIUM CHLORIDE 0.9 % IV SOLN
10.0000 mg | Freq: Once | INTRAVENOUS | Status: AC
  Administered 2022-09-23: 10 mg via INTRAVENOUS
  Filled 2022-09-23: qty 10

## 2022-09-23 MED ORDER — SODIUM CHLORIDE 0.9% FLUSH
10.0000 mL | INTRAVENOUS | Status: DC | PRN
  Administered 2022-09-23: 10 mL

## 2022-09-23 MED ORDER — SODIUM CHLORIDE 0.9 % IV SOLN: Freq: Once | INTRAVENOUS | Status: AC

## 2022-09-23 MED ORDER — SODIUM CHLORIDE 0.9 % IV SOLN: Freq: Once | INTRAVENOUS | Status: DC

## 2022-09-23 MED ORDER — DIPHENHYDRAMINE HCL 50 MG/ML IJ SOLN
50.0000 mg | Freq: Once | INTRAMUSCULAR | Status: AC
  Administered 2022-09-23: 50 mg via INTRAVENOUS
  Filled 2022-09-23: qty 1

## 2022-09-23 MED ORDER — SODIUM CHLORIDE 0.9 % IV SOLN
196.6000 mg | Freq: Once | INTRAVENOUS | Status: AC
  Administered 2022-09-23: 200 mg via INTRAVENOUS
  Filled 2022-09-23: qty 20

## 2022-09-23 MED ORDER — FAMOTIDINE IN NACL 20-0.9 MG/50ML-% IV SOLN
20.0000 mg | Freq: Once | INTRAVENOUS | Status: AC
  Administered 2022-09-23: 20 mg via INTRAVENOUS
  Filled 2022-09-23: qty 50

## 2022-09-23 MED ORDER — HEPARIN SOD (PORK) LOCK FLUSH 100 UNIT/ML IV SOLN
500.0000 [IU] | Freq: Once | INTRAVENOUS | Status: AC | PRN
  Administered 2022-09-23: 500 [IU]

## 2022-09-23 MED ORDER — SODIUM CHLORIDE 0.9 % IV SOLN
45.0000 mg/m2 | Freq: Once | INTRAVENOUS | Status: AC
  Administered 2022-09-23: 108 mg via INTRAVENOUS
  Filled 2022-09-23: qty 18

## 2022-09-23 MED ORDER — PALONOSETRON HCL INJECTION 0.25 MG/5ML
0.2500 mg | Freq: Once | INTRAVENOUS | Status: AC
  Administered 2022-09-23: 0.25 mg via INTRAVENOUS
  Filled 2022-09-23: qty 5

## 2022-09-23 NOTE — Patient Instructions (Signed)
Carboplatin Injection What is this medication? CARBOPLATIN (KAR boe pla tin) treats some types of cancer. It works by slowing down the growth of cancer cells. This medicine may be used for other purposes; ask your health care provider or pharmacist if you have questions. COMMON BRAND NAME(S): Paraplatin What should I tell my care team before I take this medication? They need to know if you have any of these conditions: Blood disorders Hearing problems Kidney disease Recent or ongoing radiation therapy An unusual or allergic reaction to carboplatin, cisplatin, other medications, foods, dyes, or preservatives Pregnant or trying to get pregnant Breast-feeding How should I use this medication? This medication is injected into a vein. It is given by your care team in a hospital or clinic setting. Talk to your care team about the use of this medication in children. Special care may be needed. Overdosage: If you think you have taken too much of this medicine contact a poison control center or emergency room at once. NOTE: This medicine is only for you. Do not share this medicine with others. What if I miss a dose? Keep appointments for follow-up doses. It is important not to miss your dose. Call your care team if you are unable to keep an appointment. What may interact with this medication? Medications for seizures Some antibiotics, such as amikacin, gentamicin, neomycin, streptomycin, tobramycin Vaccines This list may not describe all possible interactions. Give your health care provider a list of all the medicines, herbs, non-prescription drugs, or dietary supplements you use. Also tell them if you smoke, drink alcohol, or use illegal drugs. Some items may interact with your medicine. What should I watch for while using this medication? Your condition will be monitored carefully while you are receiving this medication. You may need blood work while taking this medication. This medication may  make you feel generally unwell. This is not uncommon, as chemotherapy can affect healthy cells as well as cancer cells. Report any side effects. Continue your course of treatment even though you feel ill unless your care team tells you to stop. In some cases, you may be given additional medications to help with side effects. Follow all directions for their use. This medication may increase your risk of getting an infection. Call your care team for advice if you get a fever, chills, sore throat, or other symptoms of a cold or flu. Do not treat yourself. Try to avoid being around people who are sick. Avoid taking medications that contain aspirin, acetaminophen, ibuprofen, naproxen, or ketoprofen unless instructed by your care team. These medications may hide a fever. Be careful brushing or flossing your teeth or using a toothpick because you may get an infection or bleed more easily. If you have any dental work done, tell your dentist you are receiving this medication. Talk to your care team if you wish to become pregnant or think you might be pregnant. This medication can cause serious birth defects. Talk to your care team about effective forms of contraception. Do not breast-feed while taking this medication. What side effects may I notice from receiving this medication? Side effects that you should report to your care team as soon as possible: Allergic reactions--skin rash, itching, hives, swelling of the face, lips, tongue, or throat Infection--fever, chills, cough, sore throat, wounds that don't heal, pain or trouble when passing urine, general feeling of discomfort or being unwell Low red blood cell level--unusual weakness or fatigue, dizziness, headache, trouble breathing Pain, tingling, or numbness in the hands or  feet, muscle weakness, change in vision, confusion or trouble speaking, loss of balance or coordination, trouble walking, seizures Unusual bruising or bleeding Side effects that usually  do not require medical attention (report to your care team if they continue or are bothersome): Hair loss Nausea Unusual weakness or fatigue Vomiting This list may not describe all possible side effects. Call your doctor for medical advice about side effects. You may report side effects to FDA at 1-800-FDA-1088. Where should I keep my medication? This medication is given in a hospital or clinic. It will not be stored at home. NOTE: This sheet is a summary. It may not cover all possible information. If you have questions about this medicine, talk to your doctor, pharmacist, or health care provider.  2023 Elsevier/Gold Standard (2022-02-17 00:00:00) Paclitaxel Injection What is this medication? PACLITAXEL (PAK li TAX el) treats some types of cancer. It works by slowing down the growth of cancer cells. This medicine may be used for other purposes; ask your health care provider or pharmacist if you have questions. COMMON BRAND NAME(S): Onxol, Taxol What should I tell my care team before I take this medication? They need to know if you have any of these conditions: Heart disease Liver disease Low white blood cell levels An unusual or allergic reaction to paclitaxel, other medications, foods, dyes, or preservatives If you or your partner are pregnant or trying to get pregnant Breast-feeding How should I use this medication? This medication is injected into a vein. It is given by your care team in a hospital or clinic setting. Talk to your care team about the use of this medication in children. While it may be given to children for selected conditions, precautions do apply. Overdosage: If you think you have taken too much of this medicine contact a poison control center or emergency room at once. NOTE: This medicine is only for you. Do not share this medicine with others. What if I miss a dose? Keep appointments for follow-up doses. It is important not to miss your dose. Call your care team if  you are unable to keep an appointment. What may interact with this medication? Do not take this medication with any of the following: Live virus vaccines Other medications may affect the way this medication works. Talk with your care team about all of the medications you take. They may suggest changes to your treatment plan to lower the risk of side effects and to make sure your medications work as intended. This list may not describe all possible interactions. Give your health care provider a list of all the medicines, herbs, non-prescription drugs, or dietary supplements you use. Also tell them if you smoke, drink alcohol, or use illegal drugs. Some items may interact with your medicine. What should I watch for while using this medication? Your condition will be monitored carefully while you are receiving this medication. You may need blood work while taking this medication. This medication may make you feel generally unwell. This is not uncommon as chemotherapy can affect healthy cells as well as cancer cells. Report any side effects. Continue your course of treatment even though you feel ill unless your care team tells you to stop. This medication can cause serious allergic reactions. To reduce the risk, your care team may give you other medications to take before receiving this one. Be sure to follow the directions from your care team. This medication may increase your risk of getting an infection. Call your care team for advice if  you get a fever, chills, sore throat, or other symptoms of a cold or flu. Do not treat yourself. Try to avoid being around people who are sick. This medication may increase your risk to bruise or bleed. Call your care team if you notice any unusual bleeding. Be careful brushing or flossing your teeth or using a toothpick because you may get an infection or bleed more easily. If you have any dental work done, tell your dentist you are receiving this medication. Talk to  your care team if you may be pregnant. Serious birth defects can occur if you take this medication during pregnancy. Talk to your care team before breastfeeding. Changes to your treatment plan may be needed. What side effects may I notice from receiving this medication? Side effects that you should report to your care team as soon as possible: Allergic reactions--skin rash, itching, hives, swelling of the face, lips, tongue, or throat Heart rhythm changes--fast or irregular heartbeat, dizziness, feeling faint or lightheaded, chest pain, trouble breathing Increase in blood pressure Infection--fever, chills, cough, sore throat, wounds that don't heal, pain or trouble when passing urine, general feeling of discomfort or being unwell Low blood pressure--dizziness, feeling faint or lightheaded, blurry vision Low red blood cell level--unusual weakness or fatigue, dizziness, headache, trouble breathing Painful swelling, warmth, or redness of the skin, blisters or sores at the infusion site Pain, tingling, or numbness in the hands or feet Slow heartbeat--dizziness, feeling faint or lightheaded, confusion, trouble breathing, unusual weakness or fatigue Unusual bruising or bleeding Side effects that usually do not require medical attention (report to your care team if they continue or are bothersome): Diarrhea Hair loss Joint pain Loss of appetite Muscle pain Nausea Vomiting This list may not describe all possible side effects. Call your doctor for medical advice about side effects. You may report side effects to FDA at 1-800-FDA-1088. Where should I keep my medication? This medication is given in a hospital or clinic. It will not be stored at home. NOTE: This sheet is a summary. It may not cover all possible information. If you have questions about this medicine, talk to your doctor, pharmacist, or health care provider.  2023 Elsevier/Gold Standard (2022-03-05 00:00:00)

## 2022-09-24 ENCOUNTER — Encounter: Payer: Self-pay | Admitting: Oncology

## 2022-09-25 NOTE — Progress Notes (Signed)
[facility] [name]  [address] [place],  [place]  [address] [phone]  Clinic Day:  09/26/2022  Referring physician: Marice Potter, MD   HISTORY OF PRESENT ILLNESS:  The patient is a 71 y.o. 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  This patient is based upon his PET scan showing a right paratracheal lymph node, which is consistent with contralateral nodal involvement. He has completed 4 weeks of chemoradiation, which consists of weekly carboplatin/paclitaxel.  Thus far, he has tolerated his treatments well.  He has had a mild degree of dysphagia from his radiation, but he claims this has improved over time.  He denies having any new symptoms which concern him for disease progression.  PHYSICAL EXAM:  Blood pressure (!) 112/58, pulse (!) 117, temperature 97.7 F (36.5 C), resp. rate 20, height 6\' 3"  (1.905 m), weight 224 lb 4.8 oz (101.7 kg), SpO2 92 %. Wt Readings from Last 3 Encounters:  09/26/22 224 lb 4.8 oz (101.7 kg)  09/23/22 226 lb 12 oz (102.9 kg)  09/23/22 226 lb 1.6 oz (102.6 kg)   Body mass index is 28.04 kg/m. Performance status (ECOG): 1 - Symptomatic but completely ambulatory Physical Exam Constitutional:      Appearance: Normal appearance. He is not ill-appearing.  HENT:     Mouth/Throat:     Mouth: Mucous membranes are moist.     Pharynx: Oropharynx is clear. No oropharyngeal exudate or posterior oropharyngeal erythema.  Cardiovascular:     Rate and Rhythm: Normal rate and regular rhythm.     Heart sounds: No murmur heard.    No friction rub. No gallop.  Pulmonary:     Effort: Pulmonary effort is normal. No respiratory distress.     Breath sounds: Examination of the left-upper field reveals rhonchi. Examination of the left-middle field reveals rhonchi. Examination of the left-lower field reveals rhonchi. Rhonchi present. No wheezing or rales.  Abdominal:     General: Bowel sounds are normal.  There is no distension.     Palpations: Abdomen is soft. There is no mass.     Tenderness: There is no abdominal tenderness.  Musculoskeletal:        General: No swelling.     Right lower leg: No edema.     Left lower leg: No edema.  Lymphadenopathy:     Cervical: No cervical adenopathy.     Upper Body:     Right upper body: No supraclavicular or axillary adenopathy.     Left upper body: No supraclavicular or axillary adenopathy.     Lower Body: No right inguinal adenopathy. No left inguinal adenopathy.  Skin:    General: Skin is warm.     Coloration: Skin is not jaundiced.     Findings: No lesion or rash.  Neurological:     General: No focal deficit present.     Mental Status: He is alert and oriented to person, place, and time. Mental status is at baseline.  Psychiatric:        Mood and Affect: Mood normal.        Behavior: Behavior normal.        Thought Content: Thought content normal.    LABS:      Latest Ref Rng & Units 09/26/2022   12:00 AM 09/22/2022    8:36 AM 09/15/2022   12:00 AM  CBC  WBC  3.8     5.1  6.1  Hemoglobin 13.5 - 17.5 15.6     16.2  15.9      Hematocrit 41 - 53 47     49.9  48      Platelets 150 - 400 K/uL 116     174  200         This result is from an external source.      Latest Ref Rng & Units 09/22/2022    8:36 AM 09/15/2022   12:00 AM 09/05/2022   12:00 AM  CMP  Glucose 70 - 99 mg/dL 217     BUN 8 - 23 mg/dL 26  29     20       Creatinine 0.61 - 1.24 mg/dL 1.38  1.2     1.0      Sodium 135 - 145 mmol/L 132  133     137      Potassium 3.5 - 5.1 mmol/L 4.1  4.6     4.6      Chloride 98 - 111 mmol/L 98  99     102      CO2 22 - 32 mmol/L 27  25     28       Calcium 8.9 - 10.3 mg/dL 9.1  9.3     9.5      Total Protein 6.5 - 8.1 g/dL 7.4     Total Bilirubin 0.3 - 1.2 mg/dL 0.7     Alkaline Phos 38 - 126 U/L 51  56     61      AST 15 - 41 U/L 16  20     26       ALT 0 - 44 U/L 23  26     27          This result is from an external  source.    ASSESSMENT & PLAN:  A 71 y.o. 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  This patient will continue receiving concurrent chemoradiation, consisting of weekly carboplatin/paclitaxel.  Clinically, he appears to be tolerating both modalities very well.  I will see him back in another 3 weeks for repeat clinical assessment, which should be around the time he is done with his chemoradiation.  The patient understands all the plans discussed today and is in agreement with them.  Sherline Eberwein Macarthur Critchley, MD

## 2022-09-26 ENCOUNTER — Inpatient Hospital Stay (INDEPENDENT_AMBULATORY_CARE_PROVIDER_SITE_OTHER): Payer: Medicare Other | Admitting: Oncology

## 2022-09-26 ENCOUNTER — Inpatient Hospital Stay: Payer: Medicare Other

## 2022-09-26 DIAGNOSIS — C3412 Malignant neoplasm of upper lobe, left bronchus or lung: Secondary | ICD-10-CM | POA: Diagnosis not present

## 2022-09-26 DIAGNOSIS — Z5111 Encounter for antineoplastic chemotherapy: Secondary | ICD-10-CM | POA: Diagnosis not present

## 2022-09-26 LAB — CMP (CANCER CENTER ONLY)
ALT: 21 U/L (ref 0–44)
AST: 14 U/L — ABNORMAL LOW (ref 15–41)
Albumin: 3.7 g/dL (ref 3.5–5.0)
Alkaline Phosphatase: 46 U/L (ref 38–126)
Anion gap: 10 (ref 5–15)
BUN: 30 mg/dL — ABNORMAL HIGH (ref 8–23)
CO2: 25 mmol/L (ref 22–32)
Calcium: 9 mg/dL (ref 8.9–10.3)
Chloride: 97 mmol/L — ABNORMAL LOW (ref 98–111)
Creatinine: 1.26 mg/dL — ABNORMAL HIGH (ref 0.61–1.24)
GFR, Estimated: >60 mL/min (ref >60)
Glucose, Bld: 216 mg/dL — ABNORMAL HIGH (ref 70–99)
Potassium: 4.6 mmol/L (ref 3.5–5.1)
Sodium: 132 mmol/L — ABNORMAL LOW (ref 135–145)
Total Bilirubin: 1 mg/dL (ref 0.3–1.2)
Total Protein: 7.2 g/dL (ref 6.5–8.1)

## 2022-09-26 LAB — CBC: RBC: 5.28 — AB (ref 3.87–5.11)

## 2022-09-26 LAB — CBC AND DIFFERENTIAL
HCT: 47 (ref 41–53)
Hemoglobin: 15.6 (ref 13.5–17.5)
Neutrophils Absolute: 3.23
Platelets: 116 10*3/uL — AB (ref 150–400)
WBC: 3.8

## 2022-09-29 MED FILL — Paclitaxel IV Conc 300 MG/50ML (6 MG/ML): INTRAVENOUS | Qty: 18 | Status: AC

## 2022-09-29 MED FILL — Dexamethasone Sodium Phosphate Inj 100 MG/10ML: INTRAMUSCULAR | Qty: 1 | Status: AC

## 2022-09-30 ENCOUNTER — Inpatient Hospital Stay: Payer: Medicare Other

## 2022-09-30 VITALS — BP 109/60 | HR 102 | Temp 98.2°F | Resp 20 | Ht 72.7 in | Wt 225.2 lb

## 2022-09-30 DIAGNOSIS — C3412 Malignant neoplasm of upper lobe, left bronchus or lung: Secondary | ICD-10-CM

## 2022-09-30 DIAGNOSIS — Z5111 Encounter for antineoplastic chemotherapy: Secondary | ICD-10-CM | POA: Diagnosis not present

## 2022-09-30 MED ORDER — SODIUM CHLORIDE 0.9 % IV SOLN
45.0000 mg/m2 | Freq: Once | INTRAVENOUS | Status: AC
  Administered 2022-09-30: 108 mg via INTRAVENOUS
  Filled 2022-09-30: qty 18

## 2022-09-30 MED ORDER — SODIUM CHLORIDE 0.9 % IV SOLN
10.0000 mg | Freq: Once | INTRAVENOUS | Status: AC
  Administered 2022-09-30: 10 mg via INTRAVENOUS
  Filled 2022-09-30: qty 10

## 2022-09-30 MED ORDER — SODIUM CHLORIDE 0.9 % IV SOLN: Freq: Once | INTRAVENOUS | Status: AC

## 2022-09-30 MED ORDER — FAMOTIDINE IN NACL 20-0.9 MG/50ML-% IV SOLN
20.0000 mg | Freq: Once | INTRAVENOUS | Status: AC
  Administered 2022-09-30: 20 mg via INTRAVENOUS
  Filled 2022-09-30: qty 50

## 2022-09-30 MED ORDER — HEPARIN SOD (PORK) LOCK FLUSH 100 UNIT/ML IV SOLN
500.0000 [IU] | Freq: Once | INTRAVENOUS | Status: AC | PRN
  Administered 2022-09-30: 500 [IU]

## 2022-09-30 MED ORDER — SODIUM CHLORIDE 0.9% FLUSH
10.0000 mL | INTRAVENOUS | Status: DC | PRN
  Administered 2022-09-30: 10 mL

## 2022-09-30 MED ORDER — DIPHENHYDRAMINE HCL 50 MG/ML IJ SOLN
25.0000 mg | Freq: Once | INTRAMUSCULAR | Status: AC
  Administered 2022-09-30: 25 mg via INTRAVENOUS
  Filled 2022-09-30: qty 1

## 2022-09-30 MED ORDER — SODIUM CHLORIDE 0.9 % IV SOLN
212.0000 mg | Freq: Once | INTRAVENOUS | Status: AC
  Administered 2022-09-30: 210 mg via INTRAVENOUS
  Filled 2022-09-30: qty 21

## 2022-09-30 MED ORDER — PALONOSETRON HCL INJECTION 0.25 MG/5ML
0.2500 mg | Freq: Once | INTRAVENOUS | Status: AC
  Administered 2022-09-30: 0.25 mg via INTRAVENOUS
  Filled 2022-09-30: qty 5

## 2022-09-30 NOTE — Patient Instructions (Signed)
Carboplatin Injection What is this medication? CARBOPLATIN (KAR boe pla tin) treats some types of cancer. It works by slowing down the growth of cancer cells. This medicine may be used for other purposes; ask your health care provider or pharmacist if you have questions. COMMON BRAND NAME(S): Paraplatin What should I tell my care team before I take this medication? They need to know if you have any of these conditions: Blood disorders Hearing problems Kidney disease Recent or ongoing radiation therapy An unusual or allergic reaction to carboplatin, cisplatin, other medications, foods, dyes, or preservatives Pregnant or trying to get pregnant Breast-feeding How should I use this medication? This medication is injected into a vein. It is given by your care team in a hospital or clinic setting. Talk to your care team about the use of this medication in children. Special care may be needed. Overdosage: If you think you have taken too much of this medicine contact a poison control center or emergency room at once. NOTE: This medicine is only for you. Do not share this medicine with others. What if I miss a dose? Keep appointments for follow-up doses. It is important not to miss your dose. Call your care team if you are unable to keep an appointment. What may interact with this medication? Medications for seizures Some antibiotics, such as amikacin, gentamicin, neomycin, streptomycin, tobramycin Vaccines This list may not describe all possible interactions. Give your health care provider a list of all the medicines, herbs, non-prescription drugs, or dietary supplements you use. Also tell them if you smoke, drink alcohol, or use illegal drugs. Some items may interact with your medicine. What should I watch for while using this medication? Your condition will be monitored carefully while you are receiving this medication. You may need blood work while taking this medication. This medication may  make you feel generally unwell. This is not uncommon, as chemotherapy can affect healthy cells as well as cancer cells. Report any side effects. Continue your course of treatment even though you feel ill unless your care team tells you to stop. In some cases, you may be given additional medications to help with side effects. Follow all directions for their use. This medication may increase your risk of getting an infection. Call your care team for advice if you get a fever, chills, sore throat, or other symptoms of a cold or flu. Do not treat yourself. Try to avoid being around people who are sick. Avoid taking medications that contain aspirin, acetaminophen, ibuprofen, naproxen, or ketoprofen unless instructed by your care team. These medications may hide a fever. Be careful brushing or flossing your teeth or using a toothpick because you may get an infection or bleed more easily. If you have any dental work done, tell your dentist you are receiving this medication. Talk to your care team if you wish to become pregnant or think you might be pregnant. This medication can cause serious birth defects. Talk to your care team about effective forms of contraception. Do not breast-feed while taking this medication. What side effects may I notice from receiving this medication? Side effects that you should report to your care team as soon as possible: Allergic reactions--skin rash, itching, hives, swelling of the face, lips, tongue, or throat Infection--fever, chills, cough, sore throat, wounds that don't heal, pain or trouble when passing urine, general feeling of discomfort or being unwell Low red blood cell level--unusual weakness or fatigue, dizziness, headache, trouble breathing Pain, tingling, or numbness in the hands or  feet, muscle weakness, change in vision, confusion or trouble speaking, loss of balance or coordination, trouble walking, seizures Unusual bruising or bleeding Side effects that usually  do not require medical attention (report to your care team if they continue or are bothersome): Hair loss Nausea Unusual weakness or fatigue Vomiting This list may not describe all possible side effects. Call your doctor for medical advice about side effects. You may report side effects to FDA at 1-800-FDA-1088. Where should I keep my medication? This medication is given in a hospital or clinic. It will not be stored at home. NOTE: This sheet is a summary. It may not cover all possible information. If you have questions about this medicine, talk to your doctor, pharmacist, or health care provider.  2023 Elsevier/Gold Standard (2022-02-17 00:00:00) Paclitaxel Injection What is this medication? PACLITAXEL (PAK li TAX el) treats some types of cancer. It works by slowing down the growth of cancer cells. This medicine may be used for other purposes; ask your health care provider or pharmacist if you have questions. COMMON BRAND NAME(S): Onxol, Taxol What should I tell my care team before I take this medication? They need to know if you have any of these conditions: Heart disease Liver disease Low white blood cell levels An unusual or allergic reaction to paclitaxel, other medications, foods, dyes, or preservatives If you or your partner are pregnant or trying to get pregnant Breast-feeding How should I use this medication? This medication is injected into a vein. It is given by your care team in a hospital or clinic setting. Talk to your care team about the use of this medication in children. While it may be given to children for selected conditions, precautions do apply. Overdosage: If you think you have taken too much of this medicine contact a poison control center or emergency room at once. NOTE: This medicine is only for you. Do not share this medicine with others. What if I miss a dose? Keep appointments for follow-up doses. It is important not to miss your dose. Call your care team if  you are unable to keep an appointment. What may interact with this medication? Do not take this medication with any of the following: Live virus vaccines Other medications may affect the way this medication works. Talk with your care team about all of the medications you take. They may suggest changes to your treatment plan to lower the risk of side effects and to make sure your medications work as intended. This list may not describe all possible interactions. Give your health care provider a list of all the medicines, herbs, non-prescription drugs, or dietary supplements you use. Also tell them if you smoke, drink alcohol, or use illegal drugs. Some items may interact with your medicine. What should I watch for while using this medication? Your condition will be monitored carefully while you are receiving this medication. You may need blood work while taking this medication. This medication may make you feel generally unwell. This is not uncommon as chemotherapy can affect healthy cells as well as cancer cells. Report any side effects. Continue your course of treatment even though you feel ill unless your care team tells you to stop. This medication can cause serious allergic reactions. To reduce the risk, your care team may give you other medications to take before receiving this one. Be sure to follow the directions from your care team. This medication may increase your risk of getting an infection. Call your care team for advice if  you get a fever, chills, sore throat, or other symptoms of a cold or flu. Do not treat yourself. Try to avoid being around people who are sick. This medication may increase your risk to bruise or bleed. Call your care team if you notice any unusual bleeding. Be careful brushing or flossing your teeth or using a toothpick because you may get an infection or bleed more easily. If you have any dental work done, tell your dentist you are receiving this medication. Talk to  your care team if you may be pregnant. Serious birth defects can occur if you take this medication during pregnancy. Talk to your care team before breastfeeding. Changes to your treatment plan may be needed. What side effects may I notice from receiving this medication? Side effects that you should report to your care team as soon as possible: Allergic reactions--skin rash, itching, hives, swelling of the face, lips, tongue, or throat Heart rhythm changes--fast or irregular heartbeat, dizziness, feeling faint or lightheaded, chest pain, trouble breathing Increase in blood pressure Infection--fever, chills, cough, sore throat, wounds that don't heal, pain or trouble when passing urine, general feeling of discomfort or being unwell Low blood pressure--dizziness, feeling faint or lightheaded, blurry vision Low red blood cell level--unusual weakness or fatigue, dizziness, headache, trouble breathing Painful swelling, warmth, or redness of the skin, blisters or sores at the infusion site Pain, tingling, or numbness in the hands or feet Slow heartbeat--dizziness, feeling faint or lightheaded, confusion, trouble breathing, unusual weakness or fatigue Unusual bruising or bleeding Side effects that usually do not require medical attention (report to your care team if they continue or are bothersome): Diarrhea Hair loss Joint pain Loss of appetite Muscle pain Nausea Vomiting This list may not describe all possible side effects. Call your doctor for medical advice about side effects. You may report side effects to FDA at 1-800-FDA-1088. Where should I keep my medication? This medication is given in a hospital or clinic. It will not be stored at home. NOTE: This sheet is a summary. It may not cover all possible information. If you have questions about this medicine, talk to your doctor, pharmacist, or health care provider.  2023 Elsevier/Gold Standard (2022-03-05 00:00:00)

## 2022-10-02 ENCOUNTER — Ambulatory Visit (INDEPENDENT_AMBULATORY_CARE_PROVIDER_SITE_OTHER): Payer: Medicaid Other | Admitting: Podiatry

## 2022-10-02 ENCOUNTER — Encounter: Payer: Self-pay | Admitting: Podiatry

## 2022-10-02 DIAGNOSIS — Z91199 Patient's noncompliance with other medical treatment and regimen due to unspecified reason: Secondary | ICD-10-CM

## 2022-10-02 NOTE — Progress Notes (Signed)
1. No-show for appointment

## 2022-10-03 ENCOUNTER — Inpatient Hospital Stay: Payer: Medicare Other

## 2022-10-03 DIAGNOSIS — Z5111 Encounter for antineoplastic chemotherapy: Secondary | ICD-10-CM | POA: Diagnosis not present

## 2022-10-03 DIAGNOSIS — C3412 Malignant neoplasm of upper lobe, left bronchus or lung: Secondary | ICD-10-CM

## 2022-10-03 LAB — CBC WITH DIFFERENTIAL (CANCER CENTER ONLY)
Abs Immature Granulocytes: 0 10*3/uL (ref 0.00–0.07)
Basophils Absolute: 0 10*3/uL (ref 0.0–0.1)
Basophils Relative: 0 %
Eosinophils Absolute: 0 10*3/uL (ref 0.0–0.5)
Eosinophils Relative: 0 %
HCT: 45.5 % (ref 39.0–52.0)
Hemoglobin: 14.7 g/dL (ref 13.0–17.0)
Immature Granulocytes: 0 %
Lymphocytes Relative: 5 %
Lymphs Abs: 0.2 10*3/uL — ABNORMAL LOW (ref 0.7–4.0)
MCH: 29.1 pg (ref 26.0–34.0)
MCHC: 32.3 g/dL (ref 30.0–36.0)
MCV: 90.1 fL (ref 80.0–100.0)
Monocytes Absolute: 0.2 10*3/uL (ref 0.1–1.0)
Monocytes Relative: 6 %
Neutro Abs: 2.5 10*3/uL (ref 1.7–7.7)
Neutrophils Relative %: 89 %
Platelet Count: 80 10*3/uL — ABNORMAL LOW (ref 150–400)
RBC: 5.05 MIL/uL (ref 4.22–5.81)
RDW: 14.8 % (ref 11.5–15.5)
WBC Count: 2.8 10*3/uL — ABNORMAL LOW (ref 4.0–10.5)
nRBC: 0 % (ref 0.0–0.2)

## 2022-10-03 LAB — CMP (CANCER CENTER ONLY)
ALT: 24 U/L (ref 0–44)
AST: 16 U/L (ref 15–41)
Albumin: 3.7 g/dL (ref 3.5–5.0)
Alkaline Phosphatase: 44 U/L (ref 38–126)
Anion gap: 8 (ref 5–15)
BUN: 28 mg/dL — ABNORMAL HIGH (ref 8–23)
CO2: 27 mmol/L (ref 22–32)
Calcium: 9 mg/dL (ref 8.9–10.3)
Chloride: 99 mmol/L (ref 98–111)
Creatinine: 1.3 mg/dL — ABNORMAL HIGH (ref 0.61–1.24)
GFR, Estimated: 59 mL/min — ABNORMAL LOW (ref >60)
Glucose, Bld: 257 mg/dL — ABNORMAL HIGH (ref 70–99)
Potassium: 4.4 mmol/L (ref 3.5–5.1)
Sodium: 134 mmol/L — ABNORMAL LOW (ref 135–145)
Total Bilirubin: 0.7 mg/dL (ref 0.3–1.2)
Total Protein: 6.5 g/dL (ref 6.5–8.1)

## 2022-10-05 NOTE — Progress Notes (Signed)
Allen [name]  [address] [place],  [place]  [address] [phone]  Clinic Day:  10/06/2022  Referring physician: Marice Potter, MD   HISTORY OF PRESENT ILLNESS:  The patient is a 71 y.o. 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  He comes in today to be evaluated before he heads into the final 2 weeks of his chemoradiation, which consists of weekly carboplatin/paclitaxel.  Thus far, he has tolerated his treatments well.  The only problem the patient complains of is increased coughing, which is usually dry in nature.  He denies having other respiratory symptoms which concern him for possible disease progression while on his chemoradiation.  PHYSICAL EXAM:  Blood pressure (!) 110/52, pulse (!) 121, temperature 98.4 F (36.9 C), resp. rate 20, height 6\' 3"  (1.905 m), weight 221 lb 3.2 oz (100.3 kg), SpO2 91 %. Wt Readings from Last 3 Encounters:  10/06/22 221 lb 3.2 oz (100.3 kg)  09/30/22 225 lb 4 oz (102.2 kg)  09/30/22 223 lb 14.4 oz (101.6 kg)   Body mass index is 27.65 kg/m. Performance status (ECOG): 1 - Symptomatic but completely ambulatory Physical Exam Constitutional:      Appearance: Normal appearance. He is not ill-appearing.  HENT:     Mouth/Throat:     Mouth: Mucous membranes are moist.     Pharynx: Oropharynx is clear. No oropharyngeal exudate or posterior oropharyngeal erythema.  Cardiovascular:     Rate and Rhythm: Normal rate and regular rhythm.     Heart sounds: No murmur heard.    No friction rub. No gallop.  Pulmonary:     Effort: Pulmonary effort is normal. No respiratory distress.     Breath sounds: Decreased air movement present. No wheezing, rhonchi or rales.  Abdominal:     General: Bowel sounds are normal. There is no distension.     Palpations: Abdomen is soft. There is no mass.     Tenderness: There is no abdominal tenderness.  Musculoskeletal:        General: No swelling.      Right lower leg: No edema.     Left lower leg: No edema.  Lymphadenopathy:     Cervical: No cervical adenopathy.     Upper Body:     Right upper body: No supraclavicular or axillary adenopathy.     Left upper body: No supraclavicular or axillary adenopathy.     Lower Body: No right inguinal adenopathy. No left inguinal adenopathy.  Skin:    General: Skin is warm.     Coloration: Skin is not jaundiced.     Findings: No lesion or rash.  Neurological:     General: No focal deficit present.     Mental Status: He is alert and oriented to person, place, and time. Mental status is at baseline.  Psychiatric:        Mood and Affect: Mood normal.        Behavior: Behavior normal.        Thought Content: Thought content normal.   LABS:      Latest Ref Rng & Units 10/03/2022    7:56 AM 09/26/2022   12:00 AM 09/22/2022    8:36 AM  CBC  WBC 4.0 - 10.5 K/uL 2.8  3.8     5.1   Hemoglobin 13.0 - 17.0 g/dL 14.7  15.6     16.2   Hematocrit 39.0 - 52.0 % 45.5  47  49.9   Platelets 150 - 400 K/uL 80  116     174      This result is from an external source.      Latest Ref Rng & Units 10/03/2022    7:56 AM 09/26/2022    8:25 AM 09/22/2022    8:36 AM  CMP  Glucose 70 - 99 mg/dL 257  216  217   BUN 8 - 23 mg/dL 28  30  26    Creatinine 0.61 - 1.24 mg/dL 1.30  1.26  1.38   Sodium 135 - 145 mmol/L 134  132  132   Potassium 3.5 - 5.1 mmol/L 4.4  4.6  4.1   Chloride 98 - 111 mmol/L 99  97  98   CO2 22 - 32 mmol/L 27  25  27    Calcium 8.9 - 10.3 mg/dL 9.0  9.0  9.1   Total Protein 6.5 - 8.1 g/dL 6.5  7.2  7.4   Total Bilirubin 0.3 - 1.2 mg/dL 0.7  1.0  0.7   Alkaline Phos 38 - 126 U/L 44  46  51   AST 15 - 41 U/L 16  14  16    ALT 0 - 44 U/L 24  21  23      ASSESSMENT & PLAN:  A 71 y.o. 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  This patient will continue receiving concurrent chemoradiation, consisting of weekly carboplatin/paclitaxel.  He only has 2 more weeks  of therapy left.  Clinically, he appears to be tolerating both modalities very well.  With respect to his coughing, I will prescribe him Tessalon Perles.  He understands his coughing is likely related to his radiation irritating his bronchial tree lining.  Otherwise, I will see this patient back in early January 2024.  A chest CT will be done a day before his next visit to ascertain his new disease baseline after the completion of his chemoradiation.  If his scans show disease improvement, the patient understands that he will proceed with maintenance durvalumab immunotherapy for 1 full year.  The patient understands all the plans discussed today and is in agreement with them.  Allen Branca Macarthur Critchley, MD

## 2022-10-06 ENCOUNTER — Other Ambulatory Visit: Payer: Self-pay | Admitting: Pharmacist

## 2022-10-06 ENCOUNTER — Other Ambulatory Visit: Payer: Medicare Other

## 2022-10-06 ENCOUNTER — Other Ambulatory Visit: Payer: Self-pay | Admitting: Oncology

## 2022-10-06 ENCOUNTER — Inpatient Hospital Stay (INDEPENDENT_AMBULATORY_CARE_PROVIDER_SITE_OTHER): Payer: Medicare Other | Admitting: Oncology

## 2022-10-06 DIAGNOSIS — C3412 Malignant neoplasm of upper lobe, left bronchus or lung: Secondary | ICD-10-CM

## 2022-10-06 MED ORDER — BENZONATATE 100 MG PO CAPS: 100.0000 mg | ORAL_CAPSULE | Freq: Three times a day (TID) | ORAL | 1 refills | Status: DC | PRN

## 2022-10-06 MED FILL — Carboplatin IV Soln 450 MG/45ML: INTRAVENOUS | Qty: 20 | Status: AC

## 2022-10-06 MED FILL — Dexamethasone Sodium Phosphate Inj 100 MG/10ML: INTRAMUSCULAR | Qty: 1 | Status: AC

## 2022-10-06 MED FILL — Paclitaxel IV Conc 300 MG/50ML (6 MG/ML): INTRAVENOUS | Qty: 18 | Status: AC

## 2022-10-06 NOTE — Progress Notes (Signed)
Ok to proceed with chemo despite plt=80K per Dr. Bobby Rumpf.

## 2022-10-07 ENCOUNTER — Inpatient Hospital Stay: Payer: Medicare Other

## 2022-10-07 ENCOUNTER — Other Ambulatory Visit: Payer: Self-pay

## 2022-10-07 VITALS — BP 100/69 | HR 82 | Temp 98.0°F | Resp 24 | Ht 72.7 in | Wt 221.0 lb

## 2022-10-07 DIAGNOSIS — C3412 Malignant neoplasm of upper lobe, left bronchus or lung: Secondary | ICD-10-CM

## 2022-10-07 DIAGNOSIS — Z5111 Encounter for antineoplastic chemotherapy: Secondary | ICD-10-CM | POA: Diagnosis not present

## 2022-10-07 MED ORDER — HEPARIN SOD (PORK) LOCK FLUSH 100 UNIT/ML IV SOLN: 500.0000 [IU] | Freq: Once | INTRAVENOUS | Status: DC | PRN

## 2022-10-07 MED ORDER — SODIUM CHLORIDE 0.9 % IV SOLN
10.0000 mg | Freq: Once | INTRAVENOUS | Status: AC
  Administered 2022-10-07: 10 mg via INTRAVENOUS
  Filled 2022-10-07: qty 10

## 2022-10-07 MED ORDER — DIPHENHYDRAMINE HCL 50 MG/ML IJ SOLN
25.0000 mg | Freq: Once | INTRAMUSCULAR | Status: AC
  Administered 2022-10-07: 25 mg via INTRAVENOUS
  Filled 2022-10-07: qty 1

## 2022-10-07 MED ORDER — SODIUM CHLORIDE 0.9 % IV SOLN: Freq: Once | INTRAVENOUS | Status: DC

## 2022-10-07 MED ORDER — PALONOSETRON HCL INJECTION 0.25 MG/5ML
0.2500 mg | Freq: Once | INTRAVENOUS | Status: AC
  Administered 2022-10-07: 0.25 mg via INTRAVENOUS
  Filled 2022-10-07: qty 5

## 2022-10-07 MED ORDER — FAMOTIDINE IN NACL 20-0.9 MG/50ML-% IV SOLN
20.0000 mg | Freq: Once | INTRAVENOUS | Status: AC
  Administered 2022-10-07: 20 mg via INTRAVENOUS
  Filled 2022-10-07: qty 50

## 2022-10-07 MED ORDER — SODIUM CHLORIDE 0.9% FLUSH: 10.0000 mL | INTRAVENOUS | Status: DC | PRN

## 2022-10-07 MED ORDER — SODIUM CHLORIDE 0.9 % IV SOLN
200.0000 mg | Freq: Once | INTRAVENOUS | Status: AC
  Administered 2022-10-07: 200 mg via INTRAVENOUS
  Filled 2022-10-07: qty 20

## 2022-10-07 MED ORDER — SODIUM CHLORIDE 0.9 % IV SOLN
45.0000 mg/m2 | Freq: Once | INTRAVENOUS | Status: AC
  Administered 2022-10-07: 108 mg via INTRAVENOUS
  Filled 2022-10-07: qty 18

## 2022-10-07 MED ORDER — SODIUM CHLORIDE 0.9 % IV SOLN: Freq: Once | INTRAVENOUS | Status: AC

## 2022-10-07 NOTE — Patient Instructions (Signed)
Carboplatin Injection What is this medication? CARBOPLATIN (KAR boe pla tin) treats some types of cancer. It works by slowing down the growth of cancer cells. This medicine may be used for other purposes; ask your health care provider or pharmacist if you have questions. COMMON BRAND NAME(S): Paraplatin What should I tell my care team before I take this medication? They need to know if you have any of these conditions: Blood disorders Hearing problems Kidney disease Recent or ongoing radiation therapy An unusual or allergic reaction to carboplatin, cisplatin, other medications, foods, dyes, or preservatives Pregnant or trying to get pregnant Breast-feeding How should I use this medication? This medication is injected into a vein. It is given by your care team in a hospital or clinic setting. Talk to your care team about the use of this medication in children. Special care may be needed. Overdosage: If you think you have taken too much of this medicine contact a poison control center or emergency room at once. NOTE: This medicine is only for you. Do not share this medicine with others. What if I miss a dose? Keep appointments for follow-up doses. It is important not to miss your dose. Call your care team if you are unable to keep an appointment. What may interact with this medication? Medications for seizures Some antibiotics, such as amikacin, gentamicin, neomycin, streptomycin, tobramycin Vaccines This list may not describe all possible interactions. Give your health care provider a list of all the medicines, herbs, non-prescription drugs, or dietary supplements you use. Also tell them if you smoke, drink alcohol, or use illegal drugs. Some items may interact with your medicine. What should I watch for while using this medication? Your condition will be monitored carefully while you are receiving this medication. You may need blood work while taking this medication. This medication may  make you feel generally unwell. This is not uncommon, as chemotherapy can affect healthy cells as well as cancer cells. Report any side effects. Continue your course of treatment even though you feel ill unless your care team tells you to stop. In some cases, you may be given additional medications to help with side effects. Follow all directions for their use. This medication may increase your risk of getting an infection. Call your care team for advice if you get a fever, chills, sore throat, or other symptoms of a cold or flu. Do not treat yourself. Try to avoid being around people who are sick. Avoid taking medications that contain aspirin, acetaminophen, ibuprofen, naproxen, or ketoprofen unless instructed by your care team. These medications may hide a fever. Be careful brushing or flossing your teeth or using a toothpick because you may get an infection or bleed more easily. If you have any dental work done, tell your dentist you are receiving this medication. Talk to your care team if you wish to become pregnant or think you might be pregnant. This medication can cause serious birth defects. Talk to your care team about effective forms of contraception. Do not breast-feed while taking this medication. What side effects may I notice from receiving this medication? Side effects that you should report to your care team as soon as possible: Allergic reactions--skin rash, itching, hives, swelling of the face, lips, tongue, or throat Infection--fever, chills, cough, sore throat, wounds that don't heal, pain or trouble when passing urine, general feeling of discomfort or being unwell Low red blood cell level--unusual weakness or fatigue, dizziness, headache, trouble breathing Pain, tingling, or numbness in the hands or  feet, muscle weakness, change in vision, confusion or trouble speaking, loss of balance or coordination, trouble walking, seizures Unusual bruising or bleeding Side effects that usually  do not require medical attention (report to your care team if they continue or are bothersome): Hair loss Nausea Unusual weakness or fatigue Vomiting This list may not describe all possible side effects. Call your doctor for medical advice about side effects. You may report side effects to FDA at 1-800-FDA-1088. Where should I keep my medication? This medication is given in a hospital or clinic. It will not be stored at home. NOTE: This sheet is a summary. It may not cover all possible information. If you have questions about this medicine, talk to your doctor, pharmacist, or health care provider.  2023 Elsevier/Gold Standard (2022-02-17 00:00:00) Paclitaxel Injection What is this medication? PACLITAXEL (PAK li TAX el) treats some types of cancer. It works by slowing down the growth of cancer cells. This medicine may be used for other purposes; ask your health care provider or pharmacist if you have questions. COMMON BRAND NAME(S): Onxol, Taxol What should I tell my care team before I take this medication? They need to know if you have any of these conditions: Heart disease Liver disease Low white blood cell levels An unusual or allergic reaction to paclitaxel, other medications, foods, dyes, or preservatives If you or your partner are pregnant or trying to get pregnant Breast-feeding How should I use this medication? This medication is injected into a vein. It is given by your care team in a hospital or clinic setting. Talk to your care team about the use of this medication in children. While it may be given to children for selected conditions, precautions do apply. Overdosage: If you think you have taken too much of this medicine contact a poison control center or emergency room at once. NOTE: This medicine is only for you. Do not share this medicine with others. What if I miss a dose? Keep appointments for follow-up doses. It is important not to miss your dose. Call your care team if  you are unable to keep an appointment. What may interact with this medication? Do not take this medication with any of the following: Live virus vaccines Other medications may affect the way this medication works. Talk with your care team about all of the medications you take. They may suggest changes to your treatment plan to lower the risk of side effects and to make sure your medications work as intended. This list may not describe all possible interactions. Give your health care provider a list of all the medicines, herbs, non-prescription drugs, or dietary supplements you use. Also tell them if you smoke, drink alcohol, or use illegal drugs. Some items may interact with your medicine. What should I watch for while using this medication? Your condition will be monitored carefully while you are receiving this medication. You may need blood work while taking this medication. This medication may make you feel generally unwell. This is not uncommon as chemotherapy can affect healthy cells as well as cancer cells. Report any side effects. Continue your course of treatment even though you feel ill unless your care team tells you to stop. This medication can cause serious allergic reactions. To reduce the risk, your care team may give you other medications to take before receiving this one. Be sure to follow the directions from your care team. This medication may increase your risk of getting an infection. Call your care team for advice if  you get a fever, chills, sore throat, or other symptoms of a cold or flu. Do not treat yourself. Try to avoid being around people who are sick. This medication may increase your risk to bruise or bleed. Call your care team if you notice any unusual bleeding. Be careful brushing or flossing your teeth or using a toothpick because you may get an infection or bleed more easily. If you have any dental work done, tell your dentist you are receiving this medication. Talk to  your care team if you may be pregnant. Serious birth defects can occur if you take this medication during pregnancy. Talk to your care team before breastfeeding. Changes to your treatment plan may be needed. What side effects may I notice from receiving this medication? Side effects that you should report to your care team as soon as possible: Allergic reactions--skin rash, itching, hives, swelling of the face, lips, tongue, or throat Heart rhythm changes--fast or irregular heartbeat, dizziness, feeling faint or lightheaded, chest pain, trouble breathing Increase in blood pressure Infection--fever, chills, cough, sore throat, wounds that don't heal, pain or trouble when passing urine, general feeling of discomfort or being unwell Low blood pressure--dizziness, feeling faint or lightheaded, blurry vision Low red blood cell level--unusual weakness or fatigue, dizziness, headache, trouble breathing Painful swelling, warmth, or redness of the skin, blisters or sores at the infusion site Pain, tingling, or numbness in the hands or feet Slow heartbeat--dizziness, feeling faint or lightheaded, confusion, trouble breathing, unusual weakness or fatigue Unusual bruising or bleeding Side effects that usually do not require medical attention (report to your care team if they continue or are bothersome): Diarrhea Hair loss Joint pain Loss of appetite Muscle pain Nausea Vomiting This list may not describe all possible side effects. Call your doctor for medical advice about side effects. You may report side effects to FDA at 1-800-FDA-1088. Where should I keep my medication? This medication is given in a hospital or clinic. It will not be stored at home. NOTE: This sheet is a summary. It may not cover all possible information. If you have questions about this medicine, talk to your doctor, pharmacist, or health care provider.  2023 Elsevier/Gold Standard (2022-03-05 00:00:00)

## 2022-10-13 ENCOUNTER — Encounter: Payer: Self-pay | Admitting: Oncology

## 2022-10-13 ENCOUNTER — Inpatient Hospital Stay: Payer: Medicare Other

## 2022-10-13 ENCOUNTER — Other Ambulatory Visit: Payer: Self-pay | Admitting: Oncology

## 2022-10-13 DIAGNOSIS — C3412 Malignant neoplasm of upper lobe, left bronchus or lung: Secondary | ICD-10-CM

## 2022-10-13 DIAGNOSIS — Z5111 Encounter for antineoplastic chemotherapy: Secondary | ICD-10-CM | POA: Diagnosis not present

## 2022-10-13 LAB — CBC WITH DIFFERENTIAL (CANCER CENTER ONLY)
Abs Immature Granulocytes: 0.08 10*3/uL — ABNORMAL HIGH (ref 0.00–0.07)
Basophils Absolute: 0 10*3/uL (ref 0.0–0.1)
Basophils Relative: 1 %
Eosinophils Absolute: 0 10*3/uL (ref 0.0–0.5)
Eosinophils Relative: 0 %
HCT: 41 % (ref 39.0–52.0)
Hemoglobin: 13.7 g/dL (ref 13.0–17.0)
Immature Granulocytes: 5 %
Lymphocytes Relative: 7 %
Lymphs Abs: 0.1 10*3/uL — ABNORMAL LOW (ref 0.7–4.0)
MCH: 29.3 pg (ref 26.0–34.0)
MCHC: 33.4 g/dL (ref 30.0–36.0)
MCV: 87.8 fL (ref 80.0–100.0)
Monocytes Absolute: 0.3 10*3/uL (ref 0.1–1.0)
Monocytes Relative: 15 %
Neutro Abs: 1.3 10*3/uL — ABNORMAL LOW (ref 1.7–7.7)
Neutrophils Relative %: 72 %
Platelet Count: 160 10*3/uL (ref 150–400)
RBC: 4.67 MIL/uL (ref 4.22–5.81)
RDW: 14.6 % (ref 11.5–15.5)
WBC Count: 1.8 10*3/uL — ABNORMAL LOW (ref 4.0–10.5)
nRBC: 0 % (ref 0.0–0.2)

## 2022-10-13 LAB — CMP (CANCER CENTER ONLY)
ALT: 23 U/L (ref 0–44)
AST: 15 U/L (ref 15–41)
Albumin: 3.4 g/dL — ABNORMAL LOW (ref 3.5–5.0)
Alkaline Phosphatase: 51 U/L (ref 38–126)
Anion gap: 11 (ref 5–15)
BUN: 32 mg/dL — ABNORMAL HIGH (ref 8–23)
CO2: 29 mmol/L (ref 22–32)
Calcium: 9.2 mg/dL (ref 8.9–10.3)
Chloride: 91 mmol/L — ABNORMAL LOW (ref 98–111)
Creatinine: 1.64 mg/dL — ABNORMAL HIGH (ref 0.61–1.24)
GFR, Estimated: 44 mL/min — ABNORMAL LOW (ref >60)
Glucose, Bld: 170 mg/dL — ABNORMAL HIGH (ref 70–99)
Potassium: 5.1 mmol/L (ref 3.5–5.1)
Sodium: 131 mmol/L — ABNORMAL LOW (ref 135–145)
Total Bilirubin: 0.8 mg/dL (ref 0.3–1.2)
Total Protein: 7.1 g/dL (ref 6.5–8.1)

## 2022-10-14 ENCOUNTER — Other Ambulatory Visit: Payer: Self-pay | Admitting: Oncology

## 2022-10-14 ENCOUNTER — Inpatient Hospital Stay: Payer: Medicare Other

## 2022-10-14 VITALS — BP 100/55 | HR 96 | Temp 98.3°F | Resp 18 | Ht 72.7 in

## 2022-10-14 DIAGNOSIS — E860 Dehydration: Secondary | ICD-10-CM

## 2022-10-14 DIAGNOSIS — C3412 Malignant neoplasm of upper lobe, left bronchus or lung: Secondary | ICD-10-CM

## 2022-10-14 DIAGNOSIS — Z5111 Encounter for antineoplastic chemotherapy: Secondary | ICD-10-CM | POA: Diagnosis not present

## 2022-10-14 MED ORDER — SODIUM CHLORIDE 0.9 % IV SOLN: Freq: Once | INTRAVENOUS | Status: AC

## 2022-10-14 MED ORDER — ALTEPLASE 2 MG IJ SOLR: 2.0000 mg | Freq: Once | INTRAMUSCULAR | Status: DC | PRN

## 2022-10-14 MED ORDER — SODIUM CHLORIDE 0.9% FLUSH
10.0000 mL | Freq: Once | INTRAVENOUS | Status: AC | PRN
  Administered 2022-10-14: 10 mL

## 2022-10-14 MED ORDER — HEPARIN SOD (PORK) LOCK FLUSH 100 UNIT/ML IV SOLN
500.0000 [IU] | Freq: Once | INTRAVENOUS | Status: AC | PRN
  Administered 2022-10-14: 500 [IU]

## 2022-10-14 MED ORDER — SODIUM CHLORIDE 0.9% FLUSH: 3.0000 mL | Freq: Once | INTRAVENOUS | Status: DC | PRN

## 2022-10-14 MED ORDER — HEPARIN SOD (PORK) LOCK FLUSH 100 UNIT/ML IV SOLN: 250.0000 [IU] | Freq: Once | INTRAVENOUS | Status: DC | PRN

## 2022-10-14 MED FILL — Dexamethasone Sodium Phosphate Inj 100 MG/10ML: INTRAMUSCULAR | Qty: 1 | Status: AC

## 2022-10-14 MED FILL — Paclitaxel IV Conc 300 MG/50ML (6 MG/ML): INTRAVENOUS | Qty: 18 | Status: AC

## 2022-10-14 NOTE — Progress Notes (Signed)
Patient encouraged to follow up with PCP regarding BP- States understanding.  States that he is feeling better

## 2022-10-14 NOTE — Progress Notes (Signed)
Per Dr. Orlene Erm, [employer] called into [facility] for difficulty in swallowing.

## 2022-10-14 NOTE — Patient Instructions (Addendum)
Dehydration, Adult Dehydration is condition in which there is not enough water or other fluids in the body. This happens when a person loses more fluids than he or she takes in. Important body parts cannot work right without the right amount of fluids. Any loss of fluids from the body can cause dehydration. Dehydration can be mild, worse, or very bad. It should be treated right away to keep it from getting very bad. What are the causes? This condition may be caused by: Conditions that cause loss of water or other fluids, such as: Watery poop (diarrhea). Vomiting. Sweating a lot. Peeing (urinating) a lot. Not drinking enough fluids, especially when you: Are ill. Are doing things that take a lot of energy to do. Other illnesses and conditions, such as fever or infection. Certain medicines, such as medicines that take extra fluid out of the body (diuretics). Lack of safe drinking water. Not being able to get enough water and food. What increases the risk? The following factors may make you more likely to develop this condition: Having a long-term (chronic) illness that has not been treated the right way, such as: Diabetes. Heart disease. Kidney disease. Being 65 years of age or older. Having a disability. Living in a place that is high above the ground or sea (high in altitude). The thinner, dried air causes more fluid loss. Doing exercises that put stress on your body for a long time. What are the signs or symptoms? Symptoms of dehydration depend on how bad it is. Mild or worse dehydration Thirst. Dry lips or dry mouth. Feeling dizzy or light-headed, especially when you stand up from sitting. Muscle cramps. Your body making: Dark pee (urine). Pee may be the color of tea. Less pee than normal. Less tears than normal. Headache. Very bad dehydration Changes in skin. Skin may: Be cold to the touch (clammy). Be blotchy or pale. Not go back to normal right after you lightly pinch  it and let it go. Little or no tears, pee, or sweat. Changes in vital signs, such as: Fast breathing. Low blood pressure. Weak pulse. Pulse that is more than 100 beats a minute when you are sitting still. Other changes, such as: Feeling very thirsty. Eyes that look hollow (sunken). Cold hands and feet. Being mixed up (confused). Being very tired (lethargic) or having trouble waking from sleep. Short-term weight loss. Loss of consciousness. How is this treated? Treatment for this condition depends on how bad it is. Treatment should start right away. Do not wait until your condition gets very bad. Very bad dehydration is an emergency. You will need to go to a hospital. Mild or worse dehydration can be treated at home. You may be asked to: Drink more fluids. Drink an oral rehydration solution (ORS). This drink helps get the right amounts of fluids and salts and minerals in the blood (electrolytes). Very bad dehydration can be treated: With fluids through an IV tube. By getting normal levels of salts and minerals in your blood. This is often done by giving salts and minerals through a tube. The tube is passed through your nose and into your stomach. By treating the root cause. Follow these instructions at home: Oral rehydration solution If told by your doctor, drink an ORS: Make an ORS. Use instructions on the package. Start by drinking small amounts, about  cup (120 mL) every 5-10 minutes. Slowly drink more until you have had the amount that your doctor said to have. Eating and drinking          Drink enough clear fluid to keep your pee pale yellow. If you were told to drink an ORS, finish the ORS first. Then, start slowly drinking other clear fluids. Drink fluids such as: Water. Do not drink only water. Doing that can make the salt (sodium) level in your body get too low. Water from ice chips you suck on. Fruit juice that you have added water to (diluted). Low-calorie sports  drinks. Eat foods that have the right amounts of salts and minerals, such as: Bananas. Oranges. Potatoes. Tomatoes. Spinach. Do not drink alcohol. Avoid: Drinks that have a lot of sugar. These include: High-calorie sports drinks. Fruit juice that you did not add water to. Soda. Caffeine. Foods that are greasy or have a lot of fat or sugar. General instructions Take over-the-counter and prescription medicines only as told by your doctor. Do not take salt tablets. Doing that can make the salt level in your body get too high. Return to your normal activities as told by your doctor. Ask your doctor what activities are safe for you. Keep all follow-up visits as told by your doctor. This is important. Contact a doctor if: You have pain in your belly (abdomen) and the pain: Gets worse. Stays in one place. You have a rash. You have a stiff neck. You get angry or annoyed (irritable) more easily than normal. You are more tired or have a harder time waking than normal. You feel: Weak or dizzy. Very thirsty. Get help right away if you have: Any symptoms of very bad dehydration. Symptoms of vomiting, such as: You cannot eat or drink without vomiting. Your vomiting gets worse or does not go away. Your vomit has blood or green stuff in it. Symptoms that get worse with treatment. A fever. A very bad headache. Problems with peeing or pooping (having a bowel movement), such as: Watery poop that gets worse or does not go away. Blood in your poop (stool). This may cause poop to look black and tarry. Not peeing in 6-8 hours. Peeing only a small amount of very dark pee in 6-8 hours. Trouble breathing. These symptoms may be an emergency. Do not wait to see if the symptoms will go away. Get medical help right away. Call your local emergency services (911 in the [place] Do not drive yourself to the hospital. Summary Dehydration is a condition in which there is not enough water or other fluids  in the body. This happens when a person loses more fluids than he or she takes in. Treatment for this condition depends on how bad it is. Treatment should be started right away. Do not wait until your condition gets very bad. Drink enough clear fluid to keep your pee pale yellow. If you were told to drink an oral rehydration solution (ORS), finish the ORS first. Then, start slowly drinking other clear fluids. Take over-the-counter and prescription medicines only as told by your doctor. Get help right away if you have any symptoms of very bad dehydration. This information is not intended to replace advice given to you by your health care provider. Make sure you discuss any questions you have with your health care provider. Document Revised: 03/12/2022 Document Reviewed: 06/16/2019 Elsevier Patient Education  2023 Elsevier Inc.  

## 2022-10-15 ENCOUNTER — Inpatient Hospital Stay: Payer: Medicare Other

## 2022-10-15 VITALS — BP 121/63 | HR 109 | Temp 97.7°F | Resp 22 | Wt 216.0 lb

## 2022-10-15 DIAGNOSIS — C3412 Malignant neoplasm of upper lobe, left bronchus or lung: Secondary | ICD-10-CM

## 2022-10-15 DIAGNOSIS — Z5111 Encounter for antineoplastic chemotherapy: Secondary | ICD-10-CM | POA: Diagnosis not present

## 2022-10-15 MED ORDER — HEPARIN SOD (PORK) LOCK FLUSH 100 UNIT/ML IV SOLN: 500.0000 [IU] | Freq: Once | INTRAVENOUS | Status: DC | PRN

## 2022-10-15 MED ORDER — SODIUM CHLORIDE 0.9 % IV SOLN
10.0000 mg | Freq: Once | INTRAVENOUS | Status: AC
  Administered 2022-10-15: 10 mg via INTRAVENOUS
  Filled 2022-10-15: qty 10

## 2022-10-15 MED ORDER — SODIUM CHLORIDE 0.9 % IV SOLN
45.0000 mg/m2 | Freq: Once | INTRAVENOUS | Status: AC
  Administered 2022-10-15: 108 mg via INTRAVENOUS
  Filled 2022-10-15: qty 18

## 2022-10-15 MED ORDER — PALONOSETRON HCL INJECTION 0.25 MG/5ML
0.2500 mg | Freq: Once | INTRAVENOUS | Status: AC
  Administered 2022-10-15: 0.25 mg via INTRAVENOUS
  Filled 2022-10-15: qty 5

## 2022-10-15 MED ORDER — SODIUM CHLORIDE 0.9% FLUSH
10.0000 mL | INTRAVENOUS | Status: DC | PRN
  Administered 2022-10-15: 10 mL

## 2022-10-15 MED ORDER — SODIUM CHLORIDE 0.9 % IV SOLN: Freq: Once | INTRAVENOUS | Status: AC

## 2022-10-15 MED ORDER — SODIUM CHLORIDE 0.9 % IV SOLN
174.2000 mg | Freq: Once | INTRAVENOUS | Status: AC
  Administered 2022-10-15: 170 mg via INTRAVENOUS
  Filled 2022-10-15: qty 17

## 2022-10-15 MED ORDER — FAMOTIDINE IN NACL 20-0.9 MG/50ML-% IV SOLN
20.0000 mg | Freq: Once | INTRAVENOUS | Status: AC
  Administered 2022-10-15: 20 mg via INTRAVENOUS
  Filled 2022-10-15: qty 50

## 2022-10-15 MED ORDER — DIPHENHYDRAMINE HCL 50 MG/ML IJ SOLN
25.0000 mg | Freq: Once | INTRAMUSCULAR | Status: AC
  Administered 2022-10-15: 25 mg via INTRAVENOUS
  Filled 2022-10-15: qty 1

## 2022-10-15 MED ORDER — SODIUM CHLORIDE 0.9 % IV SOLN
1000.0000 mL | Freq: Once | INTRAVENOUS | Status: AC
  Administered 2022-10-15: 1000 mL via INTRAVENOUS

## 2022-10-15 NOTE — Progress Notes (Signed)
Verbal Ok to continute treatment per Dr. Bobby Rumpf

## 2022-10-15 NOTE — Progress Notes (Signed)
Raelyn Ensign and Dr. Bobby Rumpf notified of vital signs (initial and repeat)

## 2022-10-15 NOTE — Patient Instructions (Signed)
Carboplatin Injection What is this medication? CARBOPLATIN (KAR boe pla tin) treats some types of cancer. It works by slowing down the growth of cancer cells. This medicine may be used for other purposes; ask your health care provider or pharmacist if you have questions. COMMON BRAND NAME(S): Paraplatin What should I tell my care team before I take this medication? They need to know if you have any of these conditions: Blood disorders Hearing problems Kidney disease Recent or ongoing radiation therapy An unusual or allergic reaction to carboplatin, cisplatin, other medications, foods, dyes, or preservatives Pregnant or trying to get pregnant Breast-feeding How should I use this medication? This medication is injected into a vein. It is given by your care team in a hospital or clinic setting. Talk to your care team about the use of this medication in children. Special care may be needed. Overdosage: If you think you have taken too much of this medicine contact a poison control center or emergency room at once. NOTE: This medicine is only for you. Do not share this medicine with others. What if I miss a dose? Keep appointments for follow-up doses. It is important not to miss your dose. Call your care team if you are unable to keep an appointment. What may interact with this medication? Medications for seizures Some antibiotics, such as amikacin, gentamicin, neomycin, streptomycin, tobramycin Vaccines This list may not describe all possible interactions. Give your health care provider a list of all the medicines, herbs, non-prescription drugs, or dietary supplements you use. Also tell them if you smoke, drink alcohol, or use illegal drugs. Some items may interact with your medicine. What should I watch for while using this medication? Your condition will be monitored carefully while you are receiving this medication. You may need blood work while taking this medication. This medication may  make you feel generally unwell. This is not uncommon, as chemotherapy can affect healthy cells as well as cancer cells. Report any side effects. Continue your course of treatment even though you feel ill unless your care team tells you to stop. In some cases, you may be given additional medications to help with side effects. Follow all directions for their use. This medication may increase your risk of getting an infection. Call your care team for advice if you get a fever, chills, sore throat, or other symptoms of a cold or flu. Do not treat yourself. Try to avoid being around people who are sick. Avoid taking medications that contain aspirin, acetaminophen, ibuprofen, naproxen, or ketoprofen unless instructed by your care team. These medications may hide a fever. Be careful brushing or flossing your teeth or using a toothpick because you may get an infection or bleed more easily. If you have any dental work done, tell your dentist you are receiving this medication. Talk to your care team if you wish to become pregnant or think you might be pregnant. This medication can cause serious birth defects. Talk to your care team about effective forms of contraception. Do not breast-feed while taking this medication. What side effects may I notice from receiving this medication? Side effects that you should report to your care team as soon as possible: Allergic reactions--skin rash, itching, hives, swelling of the face, lips, tongue, or throat Infection--fever, chills, cough, sore throat, wounds that don't heal, pain or trouble when passing urine, general feeling of discomfort or being unwell Low red blood cell level--unusual weakness or fatigue, dizziness, headache, trouble breathing Pain, tingling, or numbness in the hands or  feet, muscle weakness, change in vision, confusion or trouble speaking, loss of balance or coordination, trouble walking, seizures Unusual bruising or bleeding Side effects that usually  do not require medical attention (report to your care team if they continue or are bothersome): Hair loss Nausea Unusual weakness or fatigue Vomiting This list may not describe all possible side effects. Call your doctor for medical advice about side effects. You may report side effects to FDA at 1-800-FDA-1088. Where should I keep my medication? This medication is given in a hospital or clinic. It will not be stored at home. NOTE: This sheet is a summary. It may not cover all possible information. If you have questions about this medicine, talk to your doctor, pharmacist, or health care provider.  2023 Elsevier/Gold Standard (2022-02-17 00:00:00) Paclitaxel Injection What is this medication? PACLITAXEL (PAK li TAX el) treats some types of cancer. It works by slowing down the growth of cancer cells. This medicine may be used for other purposes; ask your health care provider or pharmacist if you have questions. COMMON BRAND NAME(S): Onxol, Taxol What should I tell my care team before I take this medication? They need to know if you have any of these conditions: Heart disease Liver disease Low white blood cell levels An unusual or allergic reaction to paclitaxel, other medications, foods, dyes, or preservatives If you or your partner are pregnant or trying to get pregnant Breast-feeding How should I use this medication? This medication is injected into a vein. It is given by your care team in a hospital or clinic setting. Talk to your care team about the use of this medication in children. While it may be given to children for selected conditions, precautions do apply. Overdosage: If you think you have taken too much of this medicine contact a poison control center or emergency room at once. NOTE: This medicine is only for you. Do not share this medicine with others. What if I miss a dose? Keep appointments for follow-up doses. It is important not to miss your dose. Call your care team if  you are unable to keep an appointment. What may interact with this medication? Do not take this medication with any of the following: Live virus vaccines Other medications may affect the way this medication works. Talk with your care team about all of the medications you take. They may suggest changes to your treatment plan to lower the risk of side effects and to make sure your medications work as intended. This list may not describe all possible interactions. Give your health care provider a list of all the medicines, herbs, non-prescription drugs, or dietary supplements you use. Also tell them if you smoke, drink alcohol, or use illegal drugs. Some items may interact with your medicine. What should I watch for while using this medication? Your condition will be monitored carefully while you are receiving this medication. You may need blood work while taking this medication. This medication may make you feel generally unwell. This is not uncommon as chemotherapy can affect healthy cells as well as cancer cells. Report any side effects. Continue your course of treatment even though you feel ill unless your care team tells you to stop. This medication can cause serious allergic reactions. To reduce the risk, your care team may give you other medications to take before receiving this one. Be sure to follow the directions from your care team. This medication may increase your risk of getting an infection. Call your care team for advice if  you get a fever, chills, sore throat, or other symptoms of a cold or flu. Do not treat yourself. Try to avoid being around people who are sick. This medication may increase your risk to bruise or bleed. Call your care team if you notice any unusual bleeding. Be careful brushing or flossing your teeth or using a toothpick because you may get an infection or bleed more easily. If you have any dental work done, tell your dentist you are receiving this medication. Talk to  your care team if you may be pregnant. Serious birth defects can occur if you take this medication during pregnancy. Talk to your care team before breastfeeding. Changes to your treatment plan may be needed. What side effects may I notice from receiving this medication? Side effects that you should report to your care team as soon as possible: Allergic reactions--skin rash, itching, hives, swelling of the face, lips, tongue, or throat Heart rhythm changes--fast or irregular heartbeat, dizziness, feeling faint or lightheaded, chest pain, trouble breathing Increase in blood pressure Infection--fever, chills, cough, sore throat, wounds that don't heal, pain or trouble when passing urine, general feeling of discomfort or being unwell Low blood pressure--dizziness, feeling faint or lightheaded, blurry vision Low red blood cell level--unusual weakness or fatigue, dizziness, headache, trouble breathing Painful swelling, warmth, or redness of the skin, blisters or sores at the infusion site Pain, tingling, or numbness in the hands or feet Slow heartbeat--dizziness, feeling faint or lightheaded, confusion, trouble breathing, unusual weakness or fatigue Unusual bruising or bleeding Side effects that usually do not require medical attention (report to your care team if they continue or are bothersome): Diarrhea Hair loss Joint pain Loss of appetite Muscle pain Nausea Vomiting This list may not describe all possible side effects. Call your doctor for medical advice about side effects. You may report side effects to FDA at 1-800-FDA-1088. Where should I keep my medication? This medication is given in a hospital or clinic. It will not be stored at home. NOTE: This sheet is a summary. It may not cover all possible information. If you have questions about this medicine, talk to your doctor, pharmacist, or health care provider.  2023 Elsevier/Gold Standard (2022-03-05 00:00:00)

## 2022-10-15 NOTE — Progress Notes (Signed)
1L NS over 2 hours per MD. Order entered.  Raul Del [place], [place], BCPS, BCOP 10/15/2022 11:32 AM

## 2022-10-16 ENCOUNTER — Ambulatory Visit: Payer: Medicare Other

## 2022-10-16 NOTE — Progress Notes (Signed)
Dr. Orlene Erm aware of Vital Signs.  Patient scheduled for fluids at [facility] on 10/17/2022.

## 2022-10-17 ENCOUNTER — Inpatient Hospital Stay: Payer: Medicare Other | Attending: Oncology

## 2022-10-17 ENCOUNTER — Other Ambulatory Visit: Payer: Medicare Other

## 2022-10-17 ENCOUNTER — Ambulatory Visit: Payer: Medicare Other | Admitting: Oncology

## 2022-10-17 ENCOUNTER — Inpatient Hospital Stay: Payer: Medicare Other

## 2022-10-17 ENCOUNTER — Other Ambulatory Visit: Payer: Self-pay | Admitting: Oncology

## 2022-10-17 ENCOUNTER — Inpatient Hospital Stay (INDEPENDENT_AMBULATORY_CARE_PROVIDER_SITE_OTHER): Payer: Medicare Other | Admitting: Oncology

## 2022-10-17 VITALS — BP 105/55 | HR 120 | Temp 99.6°F | Resp 22 | Ht 75.0 in

## 2022-10-17 DIAGNOSIS — C3412 Malignant neoplasm of upper lobe, left bronchus or lung: Secondary | ICD-10-CM | POA: Insufficient documentation

## 2022-10-17 DIAGNOSIS — E860 Dehydration: Secondary | ICD-10-CM

## 2022-10-17 DIAGNOSIS — J069 Acute upper respiratory infection, unspecified: Secondary | ICD-10-CM | POA: Insufficient documentation

## 2022-10-17 DIAGNOSIS — R1310 Dysphagia, unspecified: Secondary | ICD-10-CM | POA: Diagnosis not present

## 2022-10-17 DIAGNOSIS — R058 Other specified cough: Secondary | ICD-10-CM | POA: Diagnosis not present

## 2022-10-17 MED ORDER — CEFTRIAXONE SODIUM 2 G IJ SOLR: 2.0000 g | Freq: Once | INTRAMUSCULAR | 0 refills | Status: AC

## 2022-10-17 MED ORDER — SODIUM CHLORIDE 0.9 % IV SOLN: INTRAVENOUS | Status: AC

## 2022-10-17 MED ORDER — SODIUM CHLORIDE 0.9% FLUSH
10.0000 mL | INTRAVENOUS | Status: DC | PRN
  Administered 2022-10-17: 10 mL

## 2022-10-17 MED ORDER — LEVOFLOXACIN 750 MG PO TABS: 750.0000 mg | ORAL_TABLET | Freq: Every day | ORAL | 0 refills | Status: DC

## 2022-10-17 MED ORDER — LIDOCAINE HCL 1 % IJ SOLN
2.0000 g | Freq: Every day | INTRAMUSCULAR | Status: DC
  Administered 2022-10-17: 2 g via INTRAMUSCULAR
  Filled 2022-10-17: qty 2000

## 2022-10-17 MED ORDER — HEPARIN SOD (PORK) LOCK FLUSH 100 UNIT/ML IV SOLN
500.0000 [IU] | Freq: Once | INTRAVENOUS | Status: AC | PRN
  Administered 2022-10-17: 500 [IU]

## 2022-10-17 MED ORDER — LEVOFLOXACIN IN D5W 500 MG/100ML IV SOLN
500.0000 mg | INTRAVENOUS | Status: DC
  Administered 2022-10-17: 500 mg via INTRAVENOUS
  Filled 2022-10-17: qty 100

## 2022-10-17 NOTE — Progress Notes (Signed)
Patient brought to exam room to be evaluated.  Feeling weak, SHOB, unsteady, rib pain, cough.  VS revealed tachycardic with pulse of 140-150, R 22-24, with orthostatic pulse, all documented in chart.  Dr. Bobby Rumpf to see patient.  Port accessed.    Dr. Bobby Rumpf ordered chest xray and medication along with NS.  All given to patient. Patient discharged from clinic at 1405 via wheelchair to brother's vehicle.  Antibiotic prescription sent to [facility].

## 2022-10-17 NOTE — Progress Notes (Unsigned)
[facility] [place]  [address] [place],  [place]  [address] [phone]  Clinic Day:  10/17/2022  Referring physician: [facility]*   HISTORY OF PRESENT ILLNESS:  The patient is a 71 y.o. 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  He comes in today to be evaluated before he heads into the final 2 weeks of his chemoradiation, which consists of weekly carboplatin/paclitaxel.  Thus far, he has tolerated his treatments well.  He continues to complain of a productive cough.  Furthermore, he has consistently required IV fluids over the past few weeks.  He also complains of intermittent odynophagia, likely related to his radiation. He does not believe any of his respiratory symptoms are due to disease progression.  PHYSICAL EXAM:  Blood pressure (!) 105/55, pulse (!) 120, temperature 99.6 F (37.6 C), temperature source Oral, resp. rate (!) 22, height 6\' 3"  (1.905 m), SpO2 92 %. Wt Readings from Last 3 Encounters:  10/21/22 204 lb 9.6 oz (92.8 kg)  10/15/22 216 lb (98 kg)  10/14/22 215 lb 3.2 oz (97.6 kg)   Body mass index is 27 kg/m. Performance status (ECOG): 1 - Symptomatic but completely ambulatory Physical Exam Constitutional:      Appearance: Normal appearance. He is not ill-appearing.  HENT:     Mouth/Throat:     Mouth: Mucous membranes are moist.     Pharynx: Oropharynx is clear. No oropharyngeal exudate or posterior oropharyngeal erythema.  Cardiovascular:     Rate and Rhythm: Normal rate and regular rhythm.     Heart sounds: No murmur heard.    No friction rub. No gallop.  Pulmonary:     Effort: Pulmonary effort is normal. No respiratory distress.     Breath sounds: Decreased air movement present. Examination of the right-middle field reveals rales. Examination of the left-middle field reveals rales. Examination of the right-lower field reveals rales. Examination of the left-lower field reveals rales.  Rales present. No wheezing or rhonchi.  Abdominal:     General: Bowel sounds are normal. There is no distension.     Palpations: Abdomen is soft. There is no mass.     Tenderness: There is no abdominal tenderness.  Musculoskeletal:        General: No swelling.     Right lower leg: No edema.     Left lower leg: No edema.  Lymphadenopathy:     Cervical: No cervical adenopathy.     Upper Body:     Right upper body: No supraclavicular or axillary adenopathy.     Left upper body: No supraclavicular or axillary adenopathy.     Lower Body: No right inguinal adenopathy. No left inguinal adenopathy.  Skin:    General: Skin is warm.     Coloration: Skin is not jaundiced.     Findings: No lesion or rash.  Neurological:     General: No focal deficit present.     Mental Status: He is alert and oriented to person, place, and time. Mental status is at baseline.  Psychiatric:        Mood and Affect: Mood normal.        Behavior: Behavior normal.        Thought Content: Thought content normal.    LABS:      Latest Ref Rng & Units 10/13/2022   11:14 AM 10/03/2022    7:56 AM 09/26/2022   12:00 AM  CBC  WBC 4.0 - 10.5  K/uL 1.8  2.8  3.8      Hemoglobin 13.0 - 17.0 g/dL 13.7  14.7  15.6      Hematocrit 39.0 - 52.0 % 41.0  45.5  47      Platelets 150 - 400 K/uL 160  80  116         This result is from an external source.      Latest Ref Rng & Units 10/13/2022   11:14 AM 10/03/2022    7:56 AM 09/26/2022    8:25 AM  CMP  Glucose 70 - 99 mg/dL 170  257  216   BUN 8 - 23 mg/dL 32  28  30   Creatinine 0.61 - 1.24 mg/dL 1.64  1.30  1.26   Sodium 135 - 145 mmol/L 131  134  132   Potassium 3.5 - 5.1 mmol/L 5.1  4.4  4.6   Chloride 98 - 111 mmol/L 91  99  97   CO2 22 - 32 mmol/L 29  27  25    Calcium 8.9 - 10.3 mg/dL 9.2  9.0  9.0   Total Protein 6.5 - 8.1 g/dL 7.1  6.5  7.2   Total Bilirubin 0.3 - 1.2 mg/dL 0.8  0.7  1.0   Alkaline Phos 38 - 126 U/L 51  44  46   AST 15 - 41 U/L 15  16   14    ALT 0 - 44 U/L 23  24  21      ASSESSMENT & PLAN:  A 71 y.o. 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  In clinic today, this gentleman was definitely orthostatic.  Based upon this, he will receive 1.5 L of normal saline to make him euvolemic.  Furthermore, this gentleman's lung exam is worrisome for an upper respiratory infection developing.  Based upon this, I will give him Rocephin and Levaquin in clinic today.  Furthermore, I will prescribe him Levaquin 750 mg p.o. daily for the next 5 days.  This patient will be finishing his chemoradiation early next week.  I will see him back in early January 2024 for repeat clinical assessment.  A chest CT will be done a day before his next visit to ascertain his new disease baseline after the completion of his chemoradiation. The patient understands all the plans discussed today and is in agreement with them.  Wonder Donaway Macarthur Critchley, MD

## 2022-10-21 ENCOUNTER — Inpatient Hospital Stay: Payer: Medicare Other

## 2022-10-21 ENCOUNTER — Encounter: Payer: Self-pay | Admitting: Oncology

## 2022-10-21 VITALS — BP 100/64 | HR 110 | Temp 98.4°F | Resp 22 | Ht 75.0 in | Wt 206.0 lb

## 2022-10-21 DIAGNOSIS — E860 Dehydration: Secondary | ICD-10-CM

## 2022-10-21 DIAGNOSIS — C3412 Malignant neoplasm of upper lobe, left bronchus or lung: Secondary | ICD-10-CM | POA: Diagnosis not present

## 2022-10-21 MED ORDER — SODIUM CHLORIDE 0.9 % IV SOLN: Freq: Once | INTRAVENOUS | Status: AC

## 2022-10-21 MED ORDER — SODIUM CHLORIDE 0.9% FLUSH: 10.0000 mL | INTRAVENOUS | Status: DC | PRN

## 2022-10-21 MED ORDER — HEPARIN SOD (PORK) LOCK FLUSH 100 UNIT/ML IV SOLN: 500.0000 [IU] | Freq: Once | INTRAVENOUS | Status: DC | PRN

## 2022-10-21 MED FILL — Ceftriaxone Sodium For Inj 250 MG: INTRAMUSCULAR | Qty: 2000 | Status: AC

## 2022-10-21 NOTE — Patient Instructions (Signed)
Dehydration, Adult Dehydration is condition in which there is not enough water or other fluids in the body. This happens when a person loses more fluids than he or she takes in. Important body parts cannot work right without the right amount of fluids. Any loss of fluids from the body can cause dehydration. Dehydration can be mild, worse, or very bad. It should be treated right away to keep it from getting very bad. What are the causes? This condition may be caused by: Conditions that cause loss of water or other fluids, such as: Watery poop (diarrhea). Vomiting. Sweating a lot. Peeing (urinating) a lot. Not drinking enough fluids, especially when you: Are ill. Are doing things that take a lot of energy to do. Other illnesses and conditions, such as fever or infection. Certain medicines, such as medicines that take extra fluid out of the body (diuretics). Lack of safe drinking water. Not being able to get enough water and food. What increases the risk? The following factors may make you more likely to develop this condition: Having a long-term (chronic) illness that has not been treated the right way, such as: Diabetes. Heart disease. Kidney disease. Being 65 years of age or older. Having a disability. Living in a place that is high above the ground or sea (high in altitude). The thinner, dried air causes more fluid loss. Doing exercises that put stress on your body for a long time. What are the signs or symptoms? Symptoms of dehydration depend on how bad it is. Mild or worse dehydration Thirst. Dry lips or dry mouth. Feeling dizzy or light-headed, especially when you stand up from sitting. Muscle cramps. Your body making: Dark pee (urine). Pee may be the color of tea. Less pee than normal. Less tears than normal. Headache. Very bad dehydration Changes in skin. Skin may: Be cold to the touch (clammy). Be blotchy or pale. Not go back to normal right after you lightly pinch  it and let it go. Little or no tears, pee, or sweat. Changes in vital signs, such as: Fast breathing. Low blood pressure. Weak pulse. Pulse that is more than 100 beats a minute when you are sitting still. Other changes, such as: Feeling very thirsty. Eyes that look hollow (sunken). Cold hands and feet. Being mixed up (confused). Being very tired (lethargic) or having trouble waking from sleep. Short-term weight loss. Loss of consciousness. How is this treated? Treatment for this condition depends on how bad it is. Treatment should start right away. Do not wait until your condition gets very bad. Very bad dehydration is an emergency. You will need to go to a hospital. Mild or worse dehydration can be treated at home. You may be asked to: Drink more fluids. Drink an oral rehydration solution (ORS). This drink helps get the right amounts of fluids and salts and minerals in the blood (electrolytes). Very bad dehydration can be treated: With fluids through an IV tube. By getting normal levels of salts and minerals in your blood. This is often done by giving salts and minerals through a tube. The tube is passed through your nose and into your stomach. By treating the root cause. Follow these instructions at home: Oral rehydration solution If told by your doctor, drink an ORS: Make an ORS. Use instructions on the package. Start by drinking small amounts, about  cup (120 mL) every 5-10 minutes. Slowly drink more until you have had the amount that your doctor said to have. Eating and drinking          Drink enough clear fluid to keep your pee pale yellow. If you were told to drink an ORS, finish the ORS first. Then, start slowly drinking other clear fluids. Drink fluids such as: Water. Do not drink only water. Doing that can make the salt (sodium) level in your body get too low. Water from ice chips you suck on. Fruit juice that you have added water to (diluted). Low-calorie sports  drinks. Eat foods that have the right amounts of salts and minerals, such as: Bananas. Oranges. Potatoes. Tomatoes. Spinach. Do not drink alcohol. Avoid: Drinks that have a lot of sugar. These include: High-calorie sports drinks. Fruit juice that you did not add water to. Soda. Caffeine. Foods that are greasy or have a lot of fat or sugar. General instructions Take over-the-counter and prescription medicines only as told by your doctor. Do not take salt tablets. Doing that can make the salt level in your body get too high. Return to your normal activities as told by your doctor. Ask your doctor what activities are safe for you. Keep all follow-up visits as told by your doctor. This is important. Contact a doctor if: You have pain in your belly (abdomen) and the pain: Gets worse. Stays in one place. You have a rash. You have a stiff neck. You get angry or annoyed (irritable) more easily than normal. You are more tired or have a harder time waking than normal. You feel: Weak or dizzy. Very thirsty. Get help right away if you have: Any symptoms of very bad dehydration. Symptoms of vomiting, such as: You cannot eat or drink without vomiting. Your vomiting gets worse or does not go away. Your vomit has blood or green stuff in it. Symptoms that get worse with treatment. A fever. A very bad headache. Problems with peeing or pooping (having a bowel movement), such as: Watery poop that gets worse or does not go away. Blood in your poop (stool). This may cause poop to look black and tarry. Not peeing in 6-8 hours. Peeing only a small amount of very dark pee in 6-8 hours. Trouble breathing. These symptoms may be an emergency. Do not wait to see if the symptoms will go away. Get medical help right away. Call your local emergency services (911 in the [place] Do not drive yourself to the hospital. Summary Dehydration is a condition in which there is not enough water or other fluids  in the body. This happens when a person loses more fluids than he or she takes in. Treatment for this condition depends on how bad it is. Treatment should be started right away. Do not wait until your condition gets very bad. Drink enough clear fluid to keep your pee pale yellow. If you were told to drink an oral rehydration solution (ORS), finish the ORS first. Then, start slowly drinking other clear fluids. Take over-the-counter and prescription medicines only as told by your doctor. Get help right away if you have any symptoms of very bad dehydration. This information is not intended to replace advice given to you by your health care provider. Make sure you discuss any questions you have with your health care provider. Document Revised: 03/12/2022 Document Reviewed: 06/16/2019 Elsevier Patient Education  2023 Elsevier Inc.  

## 2022-10-23 ENCOUNTER — Encounter: Payer: Self-pay | Admitting: Oncology

## 2022-11-18 ENCOUNTER — Ambulatory Visit: Payer: 59 | Admitting: Sports Medicine

## 2022-11-19 LAB — CBC: RBC: 3.25 — AB (ref 3.87–5.11)

## 2022-11-19 LAB — HEPATIC FUNCTION PANEL
ALT: 34 U/L (ref 10–40)
AST: 28 (ref 14–40)
Alkaline Phosphatase: 95 (ref 25–125)
Bilirubin, Total: 1

## 2022-11-19 LAB — BASIC METABOLIC PANEL
BUN: 20 (ref 4–21)
CO2: 27 — AB (ref 13–22)
Chloride: 89 — AB (ref 99–108)
Creatinine: 0.8 (ref 0.6–1.3)
Glucose: 217
Potassium: 4.3 mEq/L (ref 3.5–5.1)
Sodium: 126 — AB (ref 137–147)

## 2022-11-19 LAB — CBC AND DIFFERENTIAL
HCT: 30 — AB (ref 41–53)
Hemoglobin: 10.2 — AB (ref 13.5–17.5)
Neutrophils Absolute: 4.89
Platelets: 67 10*3/uL — AB (ref 150–400)
WBC: 5.2

## 2022-11-19 LAB — COMPREHENSIVE METABOLIC PANEL
Albumin: 3.7 (ref 3.5–5.0)
Calcium: 9.2 (ref 8.7–10.7)

## 2022-11-19 NOTE — Progress Notes (Unsigned)
[facility]  [address] [place],  [place]  [address] [phone]  Clinic Day:  10/17/2022  Referring physician: [facility]   HISTORY OF PRESENT ILLNESS:  The patient is a 72 y.o. 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  He comes in today to go over his CT scans to ascertain his new disease baseline after completing all of his chemoradiation, which consisted of weekly carboplatin/paclitaxel.  Thus far, he has tolerated his treatments well.  He continues to complain of a productive cough.  Furthermore, he has consistently required IV fluids over the past few weeks.  He also complains of intermittent odynophagia, likely related to his radiation. He does not believe any of his respiratory symptoms are due to disease progression.  PHYSICAL EXAM:  There were no vitals taken for this visit. Wt Readings from Last 3 Encounters:  10/21/22 206 lb (93.4 kg)  10/21/22 204 lb 9.6 oz (92.8 kg)  10/15/22 216 lb (98 kg)   There is no height or weight on file to calculate BMI. Performance status (ECOG): 1 - Symptomatic but completely ambulatory Physical Exam Constitutional:      Appearance: Normal appearance. He is not ill-appearing.  HENT:     Mouth/Throat:     Mouth: Mucous membranes are moist.     Pharynx: Oropharynx is clear. No oropharyngeal exudate or posterior oropharyngeal erythema.  Cardiovascular:     Rate and Rhythm: Normal rate and regular rhythm.     Heart sounds: No murmur heard.    No friction rub. No gallop.  Pulmonary:     Effort: Pulmonary effort is normal. No respiratory distress.     Breath sounds: Decreased air movement present. Examination of the right-middle field reveals rales. Examination of the left-middle field reveals rales. Examination of the right-lower field reveals rales. Examination of the left-lower field reveals rales. Rales present. No wheezing or rhonchi.  Abdominal:      General: Bowel sounds are normal. There is no distension.     Palpations: Abdomen is soft. There is no mass.     Tenderness: There is no abdominal tenderness.  Musculoskeletal:        General: No swelling.     Right lower leg: No edema.     Left lower leg: No edema.  Lymphadenopathy:     Cervical: No cervical adenopathy.     Upper Body:     Right upper body: No supraclavicular or axillary adenopathy.     Left upper body: No supraclavicular or axillary adenopathy.     Lower Body: No right inguinal adenopathy. No left inguinal adenopathy.  Skin:    General: Skin is warm.     Coloration: Skin is not jaundiced.     Findings: No lesion or rash.  Neurological:     General: No focal deficit present.     Mental Status: He is alert and oriented to person, place, and time. Mental status is at baseline.  Psychiatric:        Mood and Affect: Mood normal.        Behavior: Behavior normal.        Thought Content: Thought content normal.    LABS:      Latest Ref Rng & Units 10/13/2022   11:14 AM 10/03/2022    7:56 AM 09/26/2022   12:00 AM  CBC  WBC 4.0 - 10.5 K/uL 1.8  2.8  3.8      Hemoglobin  13.0 - 17.0 g/dL 13.7  14.7  15.6      Hematocrit 39.0 - 52.0 % 41.0  45.5  47      Platelets 150 - 400 K/uL 160  80  116         This result is from an external source.       Latest Ref Rng & Units 10/13/2022   11:14 AM 10/03/2022    7:56 AM 09/26/2022    8:25 AM  CMP  Glucose 70 - 99 mg/dL 170  257  216   BUN 8 - 23 mg/dL 32  28  30   Creatinine 0.61 - 1.24 mg/dL 1.64  1.30  1.26   Sodium 135 - 145 mmol/L 131  134  132   Potassium 3.5 - 5.1 mmol/L 5.1  4.4  4.6   Chloride 98 - 111 mmol/L 91  99  97   CO2 22 - 32 mmol/L 29  27  25    Calcium 8.9 - 10.3 mg/dL 9.2  9.0  9.0   Total Protein 6.5 - 8.1 g/dL 7.1  6.5  7.2   Total Bilirubin 0.3 - 1.2 mg/dL 0.8  0.7  1.0   Alkaline Phos 38 - 126 U/L 51  44  46   AST 15 - 41 U/L 15  16  14    ALT 0 - 44 U/L 23  24  21      ASSESSMENT &  PLAN:  A 72 y.o. 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  In clinic today, this gentleman was definitely orthostatic.  Based upon this, he will receive 1.5 L of normal saline to make him euvolemic.  Furthermore, this gentleman's lung exam is worrisome for an upper respiratory infection developing.  Based upon this, I will give him Rocephin and Levaquin in clinic today.  Furthermore, I will prescribe him Levaquin 750 mg p.o. daily for the next 5 days.  This patient will be finishing his chemoradiation early next week.  I will see him back in early January 2024 for repeat clinical assessment.  A chest CT will be done a day before his next visit to ascertain his new disease baseline after the completion of his chemoradiation. The patient understands all the plans discussed today and is in agreement with them.  Ayame Rena Macarthur Critchley, MD

## 2022-11-20 ENCOUNTER — Inpatient Hospital Stay: Payer: 59 | Attending: Oncology | Admitting: Oncology

## 2022-11-20 VITALS — BP 95/50 | HR 138 | Temp 97.6°F | Resp 22 | Ht 75.0 in | Wt 183.9 lb

## 2022-11-20 DIAGNOSIS — C3412 Malignant neoplasm of upper lobe, left bronchus or lung: Secondary | ICD-10-CM | POA: Insufficient documentation

## 2022-11-20 DIAGNOSIS — Z5112 Encounter for antineoplastic immunotherapy: Secondary | ICD-10-CM | POA: Insufficient documentation

## 2022-11-20 DIAGNOSIS — C3481 Malignant neoplasm of overlapping sites of right bronchus and lung: Secondary | ICD-10-CM | POA: Diagnosis not present

## 2022-11-21 ENCOUNTER — Encounter: Payer: Self-pay | Admitting: Oncology

## 2022-11-28 ENCOUNTER — Encounter: Payer: Self-pay | Admitting: Oncology

## 2022-11-29 ENCOUNTER — Other Ambulatory Visit: Payer: Self-pay

## 2022-12-01 ENCOUNTER — Encounter: Payer: Self-pay | Admitting: Oncology

## 2022-12-01 ENCOUNTER — Telehealth: Payer: Self-pay

## 2022-12-01 NOTE — Telephone Encounter (Signed)
Pt called to cancel his appt for tomorrow. He is going to the emergency room for pain @ rib cage. He states his PCP, Pershing Proud, told him to go to ER and  has discussed Hospice with him. I asked pt what his thought were regarding Hospice services. Pt states, "I just don't know. Will Dr Melvyn Neth come see me in the hospital"?

## 2022-12-02 ENCOUNTER — Inpatient Hospital Stay: Payer: 59

## 2022-12-02 ENCOUNTER — Other Ambulatory Visit: Payer: Self-pay

## 2022-12-02 ENCOUNTER — Ambulatory Visit: Payer: Medicare Other | Admitting: Oncology

## 2022-12-02 NOTE — Progress Notes (Incomplete)
[facility]  [address] [place],  [place]  [address] [phone]  Clinic Day:  11/20/2022  Referring physician: [facility]*   HISTORY OF PRESENT ILLNESS:  The patient is a 72 y.o. 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  He comes in today to go over his CT scans to ascertain his new disease baseline after completing all of his chemoradiation in December 2023, which consisted of weekly carboplatin/paclitaxel.  Since completing his radiation, the patient has complained of odynophagia with everything he swallows.  His overall appetite has diminished.   However, he denies having any worsening shortness of breath, hemoptysis or other respiratory systems which concern him for disease progression.    PHYSICAL EXAM:  There were no vitals taken for this visit. Wt Readings from Last 3 Encounters:  11/20/22 183 lb 14.4 oz (83.4 kg)  10/21/22 206 lb (93.4 kg)  10/21/22 204 lb 9.6 oz (92.8 kg)   There is no height or weight on file to calculate BMI. Performance status (ECOG): 1 - Symptomatic but completely ambulatory Physical Exam Constitutional:      Appearance: Normal appearance. He is not ill-appearing.  HENT:     Mouth/Throat:     Mouth: Mucous membranes are moist.     Pharynx: Oropharynx is clear. No oropharyngeal exudate or posterior oropharyngeal erythema.  Cardiovascular:     Rate and Rhythm: Regular rhythm. Tachycardia present.     Heart sounds: No murmur heard.    No friction rub. No gallop.  Pulmonary:     Effort: Pulmonary effort is normal. No respiratory distress.     Breath sounds: Decreased air movement present. Examination of the right-upper field reveals decreased breath sounds. Examination of the left-upper field reveals decreased breath sounds. Examination of the right-middle field reveals decreased breath sounds. Examination of the left-middle field reveals decreased breath sounds. Examination  of the right-lower field reveals decreased breath sounds. Examination of the left-lower field reveals decreased breath sounds. Decreased breath sounds present. No wheezing, rhonchi or rales.  Abdominal:     General: Bowel sounds are normal. There is no distension.     Palpations: Abdomen is soft. There is no mass.     Tenderness: There is no abdominal tenderness.  Musculoskeletal:        General: No swelling.     Right lower leg: No edema.     Left lower leg: No edema.  Lymphadenopathy:     Cervical: No cervical adenopathy.     Upper Body:     Right upper body: No supraclavicular or axillary adenopathy.     Left upper body: No supraclavicular or axillary adenopathy.     Lower Body: No right inguinal adenopathy. No left inguinal adenopathy.  Skin:    General: Skin is warm.     Coloration: Skin is not jaundiced.     Findings: No lesion or rash.  Neurological:     General: No focal deficit present.     Mental Status: He is alert and oriented to person, place, and time. Mental status is at baseline.  Psychiatric:        Mood and Affect: Mood normal.        Behavior: Behavior normal.        Thought Content: Thought content normal.  SCANS:  His chest CT done yesterday revealed the following: FINDINGS: Cardiovascular: Heart size normal. Trace pericardial effusion Coronary artery calcification is evident. Mild atherosclerotic calcification is  noted in the wall of the thoracic aorta. Right Port-A-Cath tip is positioned in the right atrium.  Mediastinum/Nodes: No mediastinal lymphadenopathy. There is no hilar lymphadenopathy. Mild circumferential wall thickening noted in the mid and distal esophagus. There is no axillary lymphadenopathy.  Lungs/Pleura: Centrilobular and paraseptal emphysema evident. Biapical pleuroparenchymal scarring is similar to prior.  3.1 x 1.6 cm irregular nodule in the superior segment right lower lobe is similar to prior although appears less cavitary on  today's exam (image 52/series 4). This lesion arose between the PET-CT of 08/14/2022 and the chest CTA of 10/27/2022. Lesion measures approximately 3.1 x 1.7 cm on the 10/27/2022 exam when remeasured in a similar fashion.  The cavitary left lower lobe perifissural lesion measures 2.8 x 1.9 cm today on image 67/4. This lesion also developed between the PET-CT of 08/14/2022 and the chest CTA of 10/27/2022. When I measure this lesion in a similar fashion on the 10/27/2022 exam, measurements are 3.7 x 2.4 cm consistent with some interval decrease in size.  The spiculated irregular left upper lobe pulmonary lesion found to be mildly hypermetabolic on PET-CT previously measures 12 mm today on image 30/4 which is not substantially changed since the 10/27/2022 exam.  Interval progression of peripheral tree-in-bud nodularity identified left lower lobe raising concern for atypical infection.  No dense consolidative airspace disease. No pleural effusion.  Upper Abdomen: Intraperitoneal free air is identified around the liver and in the anterior peritoneal cavity. No free fluid visible within the visualized abdomen.  Musculoskeletal: No worrisome lytic or sclerotic osseous abnormality.  IMPRESSION: 1. Intraperitoneal free air identified around the liver and in the anterior peritoneal cavity. This is new since the 10/27/2022 exam and concerning for perforated viscus. 2. The cavitary left lower lobe perifissural lesion has decreased in size in the interval. 3. The irregular nodule in the superior segment right lower lobe is similar to prior although appears less cavitary on today's exam. 4. The spiculated irregular left upper lobe pulmonary lesion found to be mildly hypermetabolic on PET-CT previously measures 12 mm today which is not substantially changed since the 10/27/2022 exam. 5. Interval progression of peripheral tree-in-bud nodularity left lower lobe raising concern for atypical  infection. 6. Mild circumferential wall thickening in the mid and distal esophagus. Esophagitis not excluded. 7. Aortic Atherosclerosis (ICD10-I70.0) and Emphysema (ICD10-J43.9).  LABS:      Latest Ref Rng & Units 11/19/2022   12:00 AM 10/13/2022   11:14 AM 10/03/2022    7:56 AM  CBC  WBC  5.2     1.8  2.8   Hemoglobin 13.5 - 17.5 10.2     13.7  14.7   Hematocrit 41 - 53 30     41.0  45.5   Platelets 150 - 400 K/uL 67     160  80      This result is from an external source.       Latest Ref Rng & Units 11/19/2022   12:00 AM 10/13/2022   11:14 AM 10/03/2022    7:56 AM  CMP  Glucose 70 - 99 mg/dL  915  190   BUN 4 - 21 20     32  28   Creatinine 0.6 - 1.3 0.8     1.64  1.30   Sodium 137 - 147 126     131  134   Potassium 3.5 - 5.1 mEq/L 4.3     5.1  4.4   Chloride 99 - 108 89  91  99   CO2 13 - 22 27     29  27    Calcium 8.7 - 10.7 9.2     9.2  9.0   Total Protein 6.5 - 8.1 g/dL  7.1  6.5   Total Bilirubin 0.3 - 1.2 mg/dL  0.8  0.7   Alkaline Phos 25 - 125 95     51  44   AST 14 - 40 28     15  16    ALT 10 - 40 U/L 34     23  24      This result is from an external source.     ASSESSMENT & PLAN:  A 72 y.o. 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  In clinic today, I went over his CT scan images with him, for which he could see that his disease appears to be stable.  However, the major concern is the free air under his diaphragm, particularly in his right upper quadrant.  This is very concerning for a perforated viscus being present.  Based upon this finding, I will send this patient to the emergency room immediately.  I will also arrange for him to get a stat CT scan of his abdomen/pelvis.  The emergency room physicians were made aware of his scan results.  They also know to speak with surgery about potential surgical intervention.  All future Marella Chimes will be held until this current issue has been rectified.  I will tentatively see this  patient back in 3 weeks for repeat clinical assessment.  The patient and his brother understand all the plans discussed today and are in agreement with them.    Shamarion Coots Macarthur Critchley, MD

## 2022-12-03 ENCOUNTER — Encounter: Payer: Self-pay | Admitting: Oncology

## 2022-12-10 NOTE — Progress Notes (Signed)
[facility]  [address] [place],  [place]  [address] [phone]  Clinic Day:  11/20/2022  Referring physician: [facility]*   HISTORY OF PRESENT ILLNESS:  The patient is a 72 y.o. 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  He comes in today to go over his CT scans to ascertain his new disease baseline after completing all of his chemoradiation in December 2023, which consisted of weekly carboplatin/paclitaxel.  Since completing his radiation, the patient has complained of odynophagia with everything he swallows.  His overall appetite has diminished.   However, he denies having any worsening shortness of breath, hemoptysis or other respiratory systems which concern him for disease progression.    PHYSICAL EXAM:  There were no vitals taken for this visit. Wt Readings from Last 3 Encounters:  11/20/22 183 lb 14.4 oz (83.4 kg)  10/21/22 206 lb (93.4 kg)  10/21/22 204 lb 9.6 oz (92.8 kg)   There is no height or weight on file to calculate BMI. Performance status (ECOG): 1 - Symptomatic but completely ambulatory Physical Exam Constitutional:      Appearance: Normal appearance. He is not ill-appearing.  HENT:     Mouth/Throat:     Mouth: Mucous membranes are moist.     Pharynx: Oropharynx is clear. No oropharyngeal exudate or posterior oropharyngeal erythema.  Cardiovascular:     Rate and Rhythm: Regular rhythm. Tachycardia present.     Heart sounds: No murmur heard.    No friction rub. No gallop.  Pulmonary:     Effort: Pulmonary effort is normal. No respiratory distress.     Breath sounds: Decreased air movement present. Examination of the right-upper field reveals decreased breath sounds. Examination of the left-upper field reveals decreased breath sounds. Examination of the right-middle field reveals decreased breath sounds. Examination of the left-middle field reveals decreased breath sounds. Examination  of the right-lower field reveals decreased breath sounds. Examination of the left-lower field reveals decreased breath sounds. Decreased breath sounds present. No wheezing, rhonchi or rales.  Abdominal:     General: Bowel sounds are normal. There is no distension.     Palpations: Abdomen is soft. There is no mass.     Tenderness: There is no abdominal tenderness.  Musculoskeletal:        General: No swelling.     Right lower leg: No edema.     Left lower leg: No edema.  Lymphadenopathy:     Cervical: No cervical adenopathy.     Upper Body:     Right upper body: No supraclavicular or axillary adenopathy.     Left upper body: No supraclavicular or axillary adenopathy.     Lower Body: No right inguinal adenopathy. No left inguinal adenopathy.  Skin:    General: Skin is warm.     Coloration: Skin is not jaundiced.     Findings: No lesion or rash.  Neurological:     General: No focal deficit present.     Mental Status: He is alert and oriented to person, place, and time. Mental status is at baseline.  Psychiatric:        Mood and Affect: Mood normal.        Behavior: Behavior normal.        Thought Content: Thought content normal.  SCANS:  His chest CT done yesterday revealed the following: FINDINGS: Cardiovascular: Heart size normal. Trace pericardial effusion Coronary artery calcification is evident. Mild atherosclerotic calcification is  noted in the wall of the thoracic aorta. Right Port-A-Cath tip is positioned in the right atrium.  Mediastinum/Nodes: No mediastinal lymphadenopathy. There is no hilar lymphadenopathy. Mild circumferential wall thickening noted in the mid and distal esophagus. There is no axillary lymphadenopathy.  Lungs/Pleura: Centrilobular and paraseptal emphysema evident. Biapical pleuroparenchymal scarring is similar to prior.  3.1 x 1.6 cm irregular nodule in the superior segment right lower lobe is similar to prior although appears less cavitary on  today's exam (image 52/series 4). This lesion arose between the PET-CT of 08/14/2022 and the chest CTA of 10/27/2022. Lesion measures approximately 3.1 x 1.7 cm on the 10/27/2022 exam when remeasured in a similar fashion.  The cavitary left lower lobe perifissural lesion measures 2.8 x 1.9 cm today on image 67/4. This lesion also developed between the PET-CT of 08/14/2022 and the chest CTA of 10/27/2022. When I measure this lesion in a similar fashion on the 10/27/2022 exam, measurements are 3.7 x 2.4 cm consistent with some interval decrease in size.  The spiculated irregular left upper lobe pulmonary lesion found to be mildly hypermetabolic on PET-CT previously measures 12 mm today on image 30/4 which is not substantially changed since the 10/27/2022 exam.  Interval progression of peripheral tree-in-bud nodularity identified left lower lobe raising concern for atypical infection.  No dense consolidative airspace disease. No pleural effusion.  Upper Abdomen: Intraperitoneal free air is identified around the liver and in the anterior peritoneal cavity. No free fluid visible within the visualized abdomen.  Musculoskeletal: No worrisome lytic or sclerotic osseous abnormality.  IMPRESSION: 1. Intraperitoneal free air identified around the liver and in the anterior peritoneal cavity. This is new since the 10/27/2022 exam and concerning for perforated viscus. 2. The cavitary left lower lobe perifissural lesion has decreased in size in the interval. 3. The irregular nodule in the superior segment right lower lobe is similar to prior although appears less cavitary on today's exam. 4. The spiculated irregular left upper lobe pulmonary lesion found to be mildly hypermetabolic on PET-CT previously measures 12 mm today which is not substantially changed since the 10/27/2022 exam. 5. Interval progression of peripheral tree-in-bud nodularity left lower lobe raising concern for atypical  infection. 6. Mild circumferential wall thickening in the mid and distal esophagus. Esophagitis not excluded. 7. Aortic Atherosclerosis (ICD10-I70.0) and Emphysema (ICD10-J43.9).  LABS:      Latest Ref Rng & Units 11/19/2022   12:00 AM 10/13/2022   11:14 AM 10/03/2022    7:56 AM  CBC  WBC  5.2     1.8  2.8   Hemoglobin 13.5 - 17.5 10.2     13.7  14.7   Hematocrit 41 - 53 30     41.0  45.5   Platelets 150 - 400 K/uL 67     160  80      This result is from an external source.       Latest Ref Rng & Units 11/19/2022   12:00 AM 10/13/2022   11:14 AM 10/03/2022    7:56 AM  CMP  Glucose 70 - 99 mg/dL  170  257   BUN 4 - 21 20     32  28   Creatinine 0.6 - 1.3 0.8     1.64  1.30   Sodium 137 - 147 126     131  134   Potassium 3.5 - 5.1 mEq/L 4.3     5.1  4.4   Chloride 99 - 108 89  91  99   CO2 13 - 22 27     29  27    Calcium 8.7 - 10.7 9.2     9.2  9.0   Total Protein 6.5 - 8.1 g/dL  7.1  6.5   Total Bilirubin 0.3 - 1.2 mg/dL  0.8  0.7   Alkaline Phos 25 - 125 95     51  44   AST 14 - 40 28     15  16    ALT 10 - 40 U/L 34     23  24      This result is from an external source.     ASSESSMENT & PLAN:  A 72 y.o. 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  In clinic today, I went over his CT scan images with him, for which he could see that his disease appears to be stable.  However, the major concern is the free air under his diaphragm, particularly in his right upper quadrant.  This is very concerning for a perforated viscus being present.  Based upon this finding, I will send this patient to the emergency room immediately.  I will also arrange for him to get a stat CT scan of his abdomen/pelvis.  The emergency room physicians were made aware of his scan results.  They also know to speak with surgery about potential surgical intervention.  All future Marella Chimes will be held until this current issue has been rectified.  I will tentatively see this  patient back in 3 weeks for repeat clinical assessment.  The patient and his brother understand all the plans discussed today and are in agreement with them.    Anitta Tenny Macarthur Critchley, MD

## 2022-12-11 ENCOUNTER — Telehealth: Payer: Self-pay | Admitting: Oncology

## 2022-12-11 ENCOUNTER — Other Ambulatory Visit: Payer: Self-pay

## 2022-12-11 ENCOUNTER — Other Ambulatory Visit: Payer: Self-pay | Admitting: Oncology

## 2022-12-11 ENCOUNTER — Inpatient Hospital Stay (INDEPENDENT_AMBULATORY_CARE_PROVIDER_SITE_OTHER): Payer: 59 | Admitting: Oncology

## 2022-12-11 ENCOUNTER — Inpatient Hospital Stay: Payer: 59

## 2022-12-11 VITALS — BP 90/50 | HR 142 | Temp 97.6°F | Resp 22 | Ht 75.0 in | Wt 172.2 lb

## 2022-12-11 DIAGNOSIS — C3412 Malignant neoplasm of upper lobe, left bronchus or lung: Secondary | ICD-10-CM

## 2022-12-11 LAB — HEPATIC FUNCTION PANEL
ALT: 21 U/L (ref 10–40)
AST: 23 (ref 14–40)
Alkaline Phosphatase: 71 (ref 25–125)
Bilirubin, Total: 1.1

## 2022-12-11 LAB — BASIC METABOLIC PANEL
BUN: 21 (ref 4–21)
CO2: 26 — AB (ref 13–22)
Chloride: 96 — AB (ref 99–108)
Creatinine: 0.7 (ref 0.6–1.3)
Glucose: 259
Potassium: 4.2 mEq/L (ref 3.5–5.1)
Sodium: 132 — AB (ref 137–147)

## 2022-12-11 LAB — CBC AND DIFFERENTIAL
HCT: 31 — AB (ref 41–53)
Hemoglobin: 10.7 — AB (ref 13.5–17.5)
Neutrophils Absolute: 4.37
Platelets: 205 10*3/uL (ref 150–400)
WBC: 5.2

## 2022-12-11 LAB — COMPREHENSIVE METABOLIC PANEL
Albumin: 3.7 (ref 3.5–5.0)
Calcium: 9.4 (ref 8.7–10.7)

## 2022-12-11 LAB — CBC: RBC: 3.04 — AB (ref 3.87–5.11)

## 2022-12-11 MED ORDER — FLUCONAZOLE 100 MG PO TABS: ORAL_TABLET | ORAL | 0 refills | Status: DC

## 2022-12-11 NOTE — Telephone Encounter (Signed)
12/11/22 spoke with patient and confirmed next appt

## 2022-12-11 NOTE — Progress Notes (Signed)
DISCONTINUE ON PATHWAY REGIMEN - Non-Small Cell Lung     A cycle is every 7 days, concurrent with RT:     Paclitaxel      Carboplatin   **Always confirm dose/schedule in your pharmacy ordering system**  REASON: Continuation Of Treatment PRIOR TREATMENT: [id number]: Carboplatin AUC=2 + Paclitaxel 45 mg/m2 Weekly During Radiation TREATMENT RESPONSE: Partial Response (PR)  START ON PATHWAY REGIMEN - Non-Small Cell Lung     A cycle is every 28 days:     Durvalumab   **Always confirm dose/schedule in your pharmacy ordering system**  Patient Characteristics: Preoperative or Nonsurgical Candidate (Clinical Staging), Stage III - Nonsurgical Candidate (Nonsquamous and Squamous), PS = 0, 1 Therapeutic Status: Preoperative or Nonsurgical Candidate (Clinical Staging) AJCC T Category: cT1b AJCC N Category: cN3 AJCC M Category: cM0 AJCC 8 Stage Grouping: IIIB ECOG Performance Status: 1 Intent of Therapy: Curative Intent, Discussed with Patient

## 2022-12-12 ENCOUNTER — Other Ambulatory Visit: Payer: Self-pay

## 2022-12-15 MED FILL — Durvalumab Soln for IV Infusion 500 MG/10ML (50 MG/ML): INTRAVENOUS | Qty: 30 | Status: AC

## 2022-12-15 NOTE — Progress Notes (Signed)
[facility] for TSH to be drawn before second cycle of Durvalumab. F/U with patient on 12/16/22 re: Prednisone rx and dose.  Ok to proceed with Durvalumab on 12/16/22.  Raul Del Watkins, [place], BCPS, BCOP 12/15/2022 11:22 AM

## 2022-12-16 ENCOUNTER — Other Ambulatory Visit: Payer: Self-pay | Admitting: Hematology and Oncology

## 2022-12-16 ENCOUNTER — Inpatient Hospital Stay: Payer: 59

## 2022-12-16 ENCOUNTER — Telehealth: Payer: Self-pay | Admitting: Hematology and Oncology

## 2022-12-16 VITALS — BP 115/62 | HR 107 | Temp 98.5°F | Resp 22 | Ht 75.0 in | Wt 174.0 lb

## 2022-12-16 DIAGNOSIS — C3412 Malignant neoplasm of upper lobe, left bronchus or lung: Secondary | ICD-10-CM

## 2022-12-16 DIAGNOSIS — Z5112 Encounter for antineoplastic immunotherapy: Secondary | ICD-10-CM | POA: Diagnosis not present

## 2022-12-16 MED ORDER — SODIUM CHLORIDE 0.9 % IV SOLN: Freq: Once | INTRAVENOUS | Status: AC

## 2022-12-16 MED ORDER — SODIUM CHLORIDE 0.9 % IV SOLN
1500.0000 mg | Freq: Once | INTRAVENOUS | Status: AC
  Administered 2022-12-16: 1500 mg via INTRAVENOUS
  Filled 2022-12-16: qty 30

## 2022-12-16 MED ORDER — SODIUM CHLORIDE 0.9% FLUSH
10.0000 mL | INTRAVENOUS | Status: DC | PRN
  Administered 2022-12-16: 10 mL

## 2022-12-16 MED ORDER — HEPARIN SOD (PORK) LOCK FLUSH 100 UNIT/ML IV SOLN
500.0000 [IU] | Freq: Once | INTRAVENOUS | Status: AC | PRN
  Administered 2022-12-16: 500 [IU]

## 2022-12-16 NOTE — Addendum Note (Signed)
Addended by: Neysa Hotter on: 12/16/2022 02:56 PM   Modules accepted: Orders

## 2022-12-16 NOTE — Telephone Encounter (Signed)
The patient is on prednisone 20 mg daily per Dr. Alcide Clever.  However, since he is starting immunotherapy, we need to attempt to taper him off of the prednisone due to interfering with the immunotherapy.  I instructed patient to take half a tablet of prednisone 20 mg daily for 1 week and then stop.  I see on his med list that he has prednisone 5 mg daily, but the patient's states he does not have this.  The patient expressed understanding.

## 2022-12-16 NOTE — Patient Instructions (Signed)
Durvalumab Injection What is this medication? DURVALUMAB (dur VAL ue mab) treats some types of cancer. It works by helping your immune system slow or stop the spread of cancer cells. It is a monoclonal antibody. This medicine may be used for other purposes; ask your health care provider or pharmacist if you have questions. COMMON BRAND NAME(S): IMFINZI What should I tell my care team before I take this medication? They need to know if you have any of these conditions: Allogeneic stem cell transplant (uses someone else's stem cells) Autoimmune diseases, such as Crohn disease, ulcerative colitis, lupus History of chest radiation Nervous system problems, such as Guillain-Barre syndrome, myasthenia gravis Organ transplant An unusual or allergic reaction to durvalumab, other medications, foods, dyes, or preservatives Pregnant or trying to get pregnant Breast-feeding How should I use this medication? This medication is infused into a vein. It is given by your care team in a hospital or clinic setting. A special MedGuide will be given to you before each treatment. Be sure to read this information carefully each time. Talk to your care team about the use of this medication in children. Special care may be needed. Overdosage: If you think you have taken too much of this medicine contact a poison control center or emergency room at once. NOTE: This medicine is only for you. Do not share this medicine with others. What if I miss a dose? Keep appointments for follow-up doses. It is important not to miss your dose. Call your care team if you are unable to keep an appointment. What may interact with this medication? Interactions have not been studied. This list may not describe all possible interactions. Give your health care provider a list of all the medicines, herbs, non-prescription drugs, or dietary supplements you use. Also tell them if you smoke, drink alcohol, or use illegal drugs. Some items may  interact with your medicine. What should I watch for while using this medication? Your condition will be monitored carefully while you are receiving this medication. You may need blood work while taking this medication. This medication may cause serious skin reactions. They can happen weeks to months after starting the medication. Contact your care team right away if you notice fevers or flu-like symptoms with a rash. The rash may be red or purple and then turn into blisters or peeling of the skin. You may also notice a red rash with swelling of the face, lips, or lymph nodes in your neck or under your arms. Tell your care team right away if you have any change in your eyesight. Talk to your care team if you may be pregnant. Serious birth defects can occur if you take this medication during pregnancy and for 3 months after the last dose. You will need a negative pregnancy test before starting this medication. Contraception is recommended while taking this medication and for 3 months after the last dose. Your care team can help you find the option that works for you. Do not breastfeed while taking this medication and for 3 months after the last dose. What side effects may I notice from receiving this medication? Side effects that you should report to your care team as soon as possible: Allergic reactions--skin rash, itching, hives, swelling of the face, lips, tongue, or throat Dry cough, shortness of breath or trouble breathing Eye pain, redness, irritation, or discharge with blurry or decreased vision Heart muscle inflammation--unusual weakness or fatigue, shortness of breath, chest pain, fast or irregular heartbeat, dizziness, swelling of the  ankles, feet, or hands Hormone gland problems--headache, sensitivity to light, unusual weakness or fatigue, dizziness, fast or irregular heartbeat, increased sensitivity to cold or heat, excessive sweating, constipation, hair loss, increased thirst or amount of  urine, tremors or shaking, irritability Infusion reactions--chest pain, shortness of breath or trouble breathing, feeling faint or lightheaded Kidney injury (glomerulonephritis)--decrease in the amount of urine, red or dark brown urine, foamy or bubbly urine, swelling of the ankles, hands, or feet Liver injury--right upper belly pain, loss of appetite, nausea, light-colored stool, dark yellow or brown urine, yellowing skin or eyes, unusual weakness or fatigue Pain, tingling, or numbness in the hands or feet, muscle weakness, change in vision, confusion or trouble speaking, loss of balance or coordination, trouble walking, seizures Rash, fever, and swollen lymph nodes Redness, blistering, peeling, or loosening of the skin, including inside the mouth Sudden or severe stomach pain, bloody diarrhea, fever, nausea, vomiting Side effects that usually do not require medical attention (report these to your care team if they continue or are bothersome): Bone, joint, or muscle pain Diarrhea Fatigue Loss of appetite Nausea Skin rash This list may not describe all possible side effects. Call your doctor for medical advice about side effects. You may report side effects to FDA at 1-800-FDA-1088. Where should I keep my medication? This medication is given in a hospital or clinic. It will not be stored at home. NOTE: This sheet is a summary. It may not cover all possible information. If you have questions about this medicine, talk to your doctor, pharmacist, or health care provider.  2023 Elsevier/Gold Standard (2022-02-24 00:00:00)

## 2022-12-16 NOTE — Progress Notes (Signed)
Kelli mosher, pa notified of pt's heart rate of 120's.  Ok to treat as planned

## 2023-01-03 LAB — LAB REPORT - SCANNED: A1c: 6

## 2023-01-11 NOTE — Progress Notes (Unsigned)
[facility]  [address] [place],  [place]  [address] [phone]  Clinic Day:  12/11/2022  Referring physician: [facility]*   HISTORY OF PRESENT ILLNESS:  The patient is a 72 y.o. 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  He comes in today to be evaluated before proceeding with his 2nd cycle of maintenance durvalumab immunotherapy. his last visit, CT scans showed improvement in his lung disease.  However, he was sent to the emergency room as his CT scan showed findings that were highly suspicious for free air under his diaphragm that was consistent with a perforated viscus.  However, after being evaluated by 2 surgeons in the hospital, they did not feel this gentleman had this problem.  Ultimately, the patient was discharged from the hospital without any surgical intervention.  Throughout this entire time, the patient denied having any abdominal pain, nausea, or other GI symptoms which ever alerted him to a perforated viscus being present.  Of note, he remains short of breath from his underlying lung cancer and emphysema from his numerous years of smoking.  PHYSICAL EXAM:  There were no vitals taken for this visit. Wt Readings from Last 3 Encounters:  12/16/22 174 lb (78.9 kg)  12/11/22 172 lb 3.2 oz (78.1 kg)  11/20/22 183 lb 14.4 oz (83.4 kg)   There is no height or weight on file to calculate BMI. Performance status (ECOG): 1 - Symptomatic but completely ambulatory Physical Exam Constitutional:      Appearance: Normal appearance. He is not ill-appearing.     Comments: A chronically ill-appearing older gentleman who is in a wheelchair.  He is wearing oxygen per nasal cannula.  HENT:     Mouth/Throat:     Mouth: Mucous membranes are moist. Oral lesions (Diffuse thrush is present) present.     Pharynx: Oropharynx is clear. No oropharyngeal exudate or posterior oropharyngeal erythema.  Cardiovascular:      Rate and Rhythm: Regular rhythm. Tachycardia present.     Heart sounds: No murmur heard.    No friction rub. No gallop.  Pulmonary:     Effort: Pulmonary effort is normal. No respiratory distress.     Breath sounds: Decreased air movement present. Examination of the right-upper field reveals decreased breath sounds. Examination of the left-upper field reveals decreased breath sounds. Examination of the right-middle field reveals decreased breath sounds. Examination of the left-middle field reveals decreased breath sounds. Examination of the right-lower field reveals decreased breath sounds. Examination of the left-lower field reveals decreased breath sounds. Decreased breath sounds present. No wheezing, rhonchi or rales.  Abdominal:     General: Bowel sounds are normal. There is no distension.     Palpations: Abdomen is soft. There is no mass.     Tenderness: There is no abdominal tenderness.  Musculoskeletal:        General: No swelling.     Right lower leg: No edema.     Left lower leg: No edema.  Lymphadenopathy:     Cervical: No cervical adenopathy.     Upper Body:     Right upper body: No supraclavicular or axillary adenopathy.     Left upper body: No supraclavicular or axillary adenopathy.     Lower Body: No right inguinal adenopathy. No left inguinal adenopathy.  Skin:    General: Skin is warm.     Coloration: Skin is not jaundiced.     Findings: No lesion or rash.  Neurological:     General: No focal deficit present.     Mental Status: He is alert and oriented to person, place, and time. Mental status is at baseline.  Psychiatric:        Mood and Affect: Mood normal.        Behavior: Behavior normal.        Thought Content: Thought content normal.    LABS:      Latest Ref Rng & Units 12/11/2022   12:00 AM 11/19/2022   12:00 AM 10/13/2022   11:14 AM  CBC  WBC  5.2     5.2     1.8   Hemoglobin 13.5 - 17.5 10.7     10.2     13.7   Hematocrit 41 - 53 31     30     41.0    Platelets 150 - 400 K/uL 205     67     160      This result is from an external source.       Latest Ref Rng & Units 12/11/2022   12:00 AM 11/19/2022   12:00 AM 10/13/2022   11:14 AM  CMP  Glucose 70 - 99 mg/dL   170   BUN 4 - '21 21     20     '$ 32   Creatinine 0.6 - 1.3 0.7     0.8     1.64   Sodium 137 - 147 132     126     131   Potassium 3.5 - 5.1 mEq/L 4.2     4.3     5.1   Chloride 99 - 108 96     89     91   CO2 13 - '22 26     27     29   '$ Calcium 8.7 - 10.7 9.4     9.2     9.2   Total Protein 6.5 - 8.1 g/dL   7.1   Total Bilirubin 0.3 - 1.2 mg/dL   0.8   Alkaline Phos 25 - 125 71     95     51   AST 14 - 40 '23     28     15   '$ ALT 10 - 40 U/L 21     34     23      This result is from an external source.     ASSESSMENT & PLAN:  A 72 y.o. 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  As mentioned previously, his recent CT scans showed improvement in his lung cancer.  Based upon this, he will proceed with the next phase of his definitive therapy, which will consist of 1 year of maintenance immunotherapy.  He will receive Durvalumab once every 4 weeks, for a total of 12 cycles, to help prevent recurrence of his lung cancer.  The patient was made aware of the potential side effects that go along with immunotherapy, including severe shortness of breath and diarrhea.  His first cycle of durvalumab is tentatively scheduled for next week.  On another note, this gentleman's physical exam today was significant for oral candidiasis.  Based upon this, I will place him on fluconazole for 1 week.  Otherwise, I will see him back 4 weeks later before he heads into his second cycle of maintenance durvalumab immunotherapy.  On the patient and his brother understand all the plans  discussed today and are in agreement with them.    Shiasia Porro Macarthur Critchley, MD

## 2023-01-12 ENCOUNTER — Inpatient Hospital Stay: Payer: 59

## 2023-01-12 ENCOUNTER — Inpatient Hospital Stay: Payer: 59 | Attending: Oncology | Admitting: Oncology

## 2023-01-12 DIAGNOSIS — Z5112 Encounter for antineoplastic immunotherapy: Secondary | ICD-10-CM | POA: Insufficient documentation

## 2023-01-12 DIAGNOSIS — Z79620 Long term (current) use of immunosuppressive biologic: Secondary | ICD-10-CM | POA: Diagnosis not present

## 2023-01-12 DIAGNOSIS — J449 Chronic obstructive pulmonary disease, unspecified: Secondary | ICD-10-CM | POA: Insufficient documentation

## 2023-01-12 DIAGNOSIS — C3412 Malignant neoplasm of upper lobe, left bronchus or lung: Secondary | ICD-10-CM

## 2023-01-12 LAB — CMP (CANCER CENTER ONLY)
ALT: 15 U/L (ref 0–44)
AST: 23 U/L (ref 15–41)
Albumin: 3.5 g/dL (ref 3.5–5.0)
Alkaline Phosphatase: 51 U/L (ref 38–126)
Anion gap: 12 (ref 5–15)
BUN: 20 mg/dL (ref 8–23)
CO2: 25 mmol/L (ref 22–32)
Calcium: 9.2 mg/dL (ref 8.9–10.3)
Chloride: 99 mmol/L (ref 98–111)
Creatinine: 0.74 mg/dL (ref 0.61–1.24)
GFR, Estimated: >60 mL/min (ref >60)
Glucose, Bld: 395 mg/dL — ABNORMAL HIGH (ref 70–99)
Potassium: 4.2 mmol/L (ref 3.5–5.1)
Sodium: 136 mmol/L (ref 135–145)
Total Bilirubin: 0.2 mg/dL — ABNORMAL LOW (ref 0.3–1.2)
Total Protein: 7 g/dL (ref 6.5–8.1)

## 2023-01-12 LAB — CBC AND DIFFERENTIAL
HCT: 38 — AB (ref 41–53)
Hemoglobin: 12.5 — AB (ref 13.5–17.5)
Neutrophils Absolute: 7.58
Platelets: 181 10*3/uL (ref 150–400)
WBC: 7.9

## 2023-01-12 LAB — TSH: TSH: 1.009 u[IU]/mL (ref 0.350–4.500)

## 2023-01-12 LAB — CBC: RBC: 3.75 — AB (ref 3.87–5.11)

## 2023-01-12 MED FILL — Durvalumab Soln for IV Infusion 500 MG/10ML (50 MG/ML): INTRAVENOUS | Qty: 30 | Status: AC

## 2023-01-13 ENCOUNTER — Inpatient Hospital Stay: Payer: 59

## 2023-01-13 VITALS — BP 109/63 | HR 119 | Temp 97.7°F | Resp 24 | Ht 75.0 in | Wt 178.5 lb

## 2023-01-13 DIAGNOSIS — C3412 Malignant neoplasm of upper lobe, left bronchus or lung: Secondary | ICD-10-CM

## 2023-01-13 DIAGNOSIS — Z5112 Encounter for antineoplastic immunotherapy: Secondary | ICD-10-CM | POA: Diagnosis not present

## 2023-01-13 MED ORDER — SODIUM CHLORIDE 0.9% FLUSH
10.0000 mL | INTRAVENOUS | Status: DC | PRN
  Administered 2023-01-13: 10 mL

## 2023-01-13 MED ORDER — SODIUM CHLORIDE 0.9 % IV SOLN: Freq: Once | INTRAVENOUS | Status: AC

## 2023-01-13 MED ORDER — HEPARIN SOD (PORK) LOCK FLUSH 100 UNIT/ML IV SOLN
500.0000 [IU] | Freq: Once | INTRAVENOUS | Status: AC | PRN
  Administered 2023-01-13: 500 [IU]

## 2023-01-13 MED ORDER — SODIUM CHLORIDE 0.9 % IV SOLN
1500.0000 mg | Freq: Once | INTRAVENOUS | Status: AC
  Administered 2023-01-13: 1500 mg via INTRAVENOUS
  Filled 2023-01-13: qty 30

## 2023-01-13 NOTE — Progress Notes (Signed)
Per Dr. Bobby Rumpf ok to proceed with Imfinizi with HR 132

## 2023-01-13 NOTE — Patient Instructions (Signed)
[facility]  Discharge Instructions: Thank you for choosing [facility] to provide your oncology and hematology care.  If you have a lab appointment with the [facility], please go directly to the [facility] and check in at the registration area.   Wear comfortable clothing and clothing appropriate for easy access to any Portacath or PICC line.   We strive to give you quality time with your provider. You may need to reschedule your appointment if you arrive late (15 or more minutes).  Arriving late affects you and other patients whose appointments are after yours.  Also, if you miss three or more appointments without notifying the office, you may be dismissed from the clinic at the provider's discretion.      For prescription refill requests, have your pharmacy contact our office and allow 72 hours for refills to be completed.    Today you received the following chemotherapy and/or immunotherapy agents Imfinzi      To help prevent nausea and vomiting after your treatment, we encourage you to take your nausea medication as directed.  BELOW ARE SYMPTOMS THAT SHOULD BE REPORTED IMMEDIATELY: *FEVER GREATER THAN 100.4 F (38 C) OR HIGHER *CHILLS OR SWEATING *NAUSEA AND VOMITING THAT IS NOT CONTROLLED WITH YOUR NAUSEA MEDICATION *UNUSUAL SHORTNESS OF BREATH *UNUSUAL BRUISING OR BLEEDING *URINARY PROBLEMS (pain or burning when urinating, or frequent urination) *BOWEL PROBLEMS (unusual diarrhea, constipation, pain near the anus) TENDERNESS IN MOUTH AND THROAT WITH OR WITHOUT PRESENCE OF ULCERS (sore throat, sores in mouth, or a toothache) UNUSUAL RASH, SWELLING OR PAIN  UNUSUAL VAGINAL DISCHARGE OR ITCHING   Items with * indicate a potential emergency and should be followed up as soon as possible or go to the Emergency Department if any problems should occur.  Please show the CHEMOTHERAPY ALERT CARD or IMMUNOTHERAPY ALERT CARD at check-in to the  Emergency Department and triage nurse.  Should you have questions after your visit or need to cancel or reschedule your appointment, please contact [facility]  Dept: [phone]  and follow the prompts.  Office hours are 8:00 a.m. to 4:30 p.m. Monday - Friday. Please note that voicemails left after 4:00 p.m. may not be returned until the following business day.  We are closed weekends and major holidays. You have access to a nurse at all times for urgent questions. Please call the main number to the clinic Dept: [phone] and follow the prompts.  For any non-urgent questions, you may also contact your provider using MyChart. We now offer e-Visits for anyone 19 and older to request care online for non-urgent symptoms. For details visit [email or web address].   Also download the MyChart app! Go to the app store, search "MyChart", open the app, select [facility], and log in with your MyChart username and password.

## 2023-01-14 ENCOUNTER — Other Ambulatory Visit: Payer: Self-pay

## 2023-02-08 NOTE — Progress Notes (Unsigned)
[facility]  [address] [place],  [place]  [address] [phone]  Clinic Day:  02/09/2023  Referring physician: [facility]*   HISTORY OF PRESENT ILLNESS:  The patient is a 72 y.o. 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  He comes in today to be evaluated before proceeding with his 3rd cycle of maintenance durvalumab immunotherapy. The patient comes into clinic today doing fairly well.  He has chronic shortness of breath.  He is in the process of getting portable oxygen.  He denies having any new findings which concern him for progression of his lung cancer.  PHYSICAL EXAM:  Blood pressure 130/72, pulse (!) 117, temperature 97.6 F (36.4 C), resp. rate 20, height 6\' 3"  (1.905 m), weight 171 lb 8 oz (77.8 kg), SpO2 90 %. Wt Readings from Last 3 Encounters:  02/09/23 171 lb 8 oz (77.8 kg)  01/13/23 178 lb 8 oz (81 kg)  01/12/23 178 lb 14.4 oz (81.1 kg)   Body mass index is 21.44 kg/m. Performance status (ECOG): 1 - Symptomatic but completely ambulatory Physical Exam Constitutional:      Appearance: Normal appearance. He is not ill-appearing.     Comments: A chronically ill-appearing older gentleman. He is wearing oxygen per nasal cannula.  HENT:     Mouth/Throat:     Mouth: Mucous membranes are moist. No oral lesions.     Pharynx: Oropharynx is clear. No oropharyngeal exudate or posterior oropharyngeal erythema.  Cardiovascular:     Rate and Rhythm: Regular rhythm. Tachycardia present.     Heart sounds: No murmur heard.    No friction rub. No gallop.  Pulmonary:     Effort: Pulmonary effort is normal. No respiratory distress.     Breath sounds: Decreased air movement present. Examination of the right-upper field reveals decreased breath sounds. Examination of the left-upper field reveals decreased breath sounds. Examination of the right-middle field reveals decreased breath sounds. Examination of the  left-middle field reveals decreased breath sounds. Examination of the right-lower field reveals decreased breath sounds. Examination of the left-lower field reveals decreased breath sounds. Decreased breath sounds present. No wheezing, rhonchi or rales.  Abdominal:     General: Bowel sounds are normal. There is no distension.     Palpations: Abdomen is soft. There is no mass.     Tenderness: There is no abdominal tenderness.  Musculoskeletal:        General: No swelling.     Right lower leg: No edema.     Left lower leg: No edema.  Lymphadenopathy:     Cervical: No cervical adenopathy.     Upper Body:     Right upper body: No supraclavicular or axillary adenopathy.     Left upper body: No supraclavicular or axillary adenopathy.     Lower Body: No right inguinal adenopathy. No left inguinal adenopathy.  Skin:    General: Skin is warm.     Coloration: Skin is not jaundiced.     Findings: No lesion or rash.  Neurological:     General: No focal deficit present.     Mental Status: He is alert and oriented to person, place, and time. Mental status is at baseline.  Psychiatric:        Mood and Affect: Mood normal.        Behavior: Behavior normal.        Thought Content: Thought content normal.    LABS:  Latest Ref Rng & Units 02/09/2023   12:00 AM 01/12/2023   12:00 AM 12/11/2022   12:00 AM  CBC  WBC  9.5     7.9     5.2      Hemoglobin 13.5 - 17.5 14.5     12.5     10.7      Hematocrit 41 - 53 44     38     31      Platelets 150 - 400 K/uL 188     181     205         This result is from an external source.      Latest Ref Rng & Units 02/09/2023    8:50 AM 01/12/2023    8:57 AM 12/11/2022   12:00 AM  CMP  Glucose 70 - 99 mg/dL 192  395    BUN 8 - 23 mg/dL 15  20  21       Creatinine 0.61 - 1.24 mg/dL 0.84  0.74  0.7      Sodium 135 - 145 mmol/L 136  136  132      Potassium 3.5 - 5.1 mmol/L 4.2  4.2  4.2      Chloride 98 - 111 mmol/L 96  99  96      CO2 22 - 32 mmol/L 31   25  26       Calcium 8.9 - 10.3 mg/dL 9.6  9.2  9.4      Total Protein 6.5 - 8.1 g/dL 7.6  7.0    Total Bilirubin 0.3 - 1.2 mg/dL 0.5  0.2    Alkaline Phos 38 - 126 U/L 78  51  71      AST 15 - 41 U/L 12  23  23       ALT 0 - 44 U/L 14  15  21          This result is from an external source.    ASSESSMENT & PLAN:  A 72 y.o. 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  He will proceed with his 3rd cycle of durvalumab immunotherapy this week.  I will see him back in 4 weeks before he heads into his 4th cycle of treatment.  The patient understands all the plans discussed today and is in agreement with them.    Shaydon Lease Macarthur Critchley, MD

## 2023-02-09 ENCOUNTER — Inpatient Hospital Stay (INDEPENDENT_AMBULATORY_CARE_PROVIDER_SITE_OTHER): Payer: 59 | Admitting: Oncology

## 2023-02-09 ENCOUNTER — Inpatient Hospital Stay: Payer: 59 | Attending: Oncology

## 2023-02-09 DIAGNOSIS — C3412 Malignant neoplasm of upper lobe, left bronchus or lung: Secondary | ICD-10-CM | POA: Insufficient documentation

## 2023-02-09 DIAGNOSIS — Z5112 Encounter for antineoplastic immunotherapy: Secondary | ICD-10-CM | POA: Diagnosis present

## 2023-02-09 DIAGNOSIS — Z79620 Long term (current) use of immunosuppressive biologic: Secondary | ICD-10-CM | POA: Insufficient documentation

## 2023-02-09 LAB — CMP (CANCER CENTER ONLY)
ALT: 14 U/L (ref 0–44)
AST: 12 U/L — ABNORMAL LOW (ref 15–41)
Albumin: 4 g/dL (ref 3.5–5.0)
Alkaline Phosphatase: 78 U/L (ref 38–126)
Anion gap: 9 (ref 5–15)
BUN: 15 mg/dL (ref 8–23)
CO2: 31 mmol/L (ref 22–32)
Calcium: 9.6 mg/dL (ref 8.9–10.3)
Chloride: 96 mmol/L — ABNORMAL LOW (ref 98–111)
Creatinine: 0.84 mg/dL (ref 0.61–1.24)
GFR, Estimated: >60 mL/min (ref >60)
Glucose, Bld: 192 mg/dL — ABNORMAL HIGH (ref 70–99)
Potassium: 4.2 mmol/L (ref 3.5–5.1)
Sodium: 136 mmol/L (ref 135–145)
Total Bilirubin: 0.5 mg/dL (ref 0.3–1.2)
Total Protein: 7.6 g/dL (ref 6.5–8.1)

## 2023-02-09 LAB — TSH: TSH: 1.539 u[IU]/mL (ref 0.350–4.500)

## 2023-02-09 LAB — CBC W DIFFERENTIAL (~~LOC~~ CC SCANNED REPORT)

## 2023-02-09 LAB — CBC AND DIFFERENTIAL
HCT: 44 (ref 41–53)
Hemoglobin: 14.5 (ref 13.5–17.5)
Neutrophils Absolute: 8.93
Platelets: 188 10*3/uL (ref 150–400)
WBC: 9.5

## 2023-02-09 LAB — CBC: RBC: 4.58 (ref 3.87–5.11)

## 2023-02-10 MED FILL — Durvalumab Soln for IV Infusion 500 MG/10ML (50 MG/ML): INTRAVENOUS | Qty: 30 | Status: AC

## 2023-02-11 ENCOUNTER — Other Ambulatory Visit: Payer: Self-pay | Admitting: Pharmacist

## 2023-02-11 ENCOUNTER — Inpatient Hospital Stay: Payer: 59

## 2023-02-11 VITALS — BP 130/80 | HR 107 | Temp 97.7°F | Resp 20 | Ht 75.0 in | Wt 170.0 lb

## 2023-02-11 DIAGNOSIS — Z5112 Encounter for antineoplastic immunotherapy: Secondary | ICD-10-CM | POA: Diagnosis not present

## 2023-02-11 DIAGNOSIS — C3412 Malignant neoplasm of upper lobe, left bronchus or lung: Secondary | ICD-10-CM

## 2023-02-11 LAB — T4: T4, Total: 5.8 ug/dL (ref 4.5–12.0)

## 2023-02-11 MED ORDER — SODIUM CHLORIDE 0.9 % IV SOLN
1500.0000 mg | Freq: Once | INTRAVENOUS | Status: AC
  Administered 2023-02-11: 1500 mg via INTRAVENOUS
  Filled 2023-02-11: qty 30

## 2023-02-11 MED ORDER — HEPARIN SOD (PORK) LOCK FLUSH 100 UNIT/ML IV SOLN
500.0000 [IU] | Freq: Once | INTRAVENOUS | Status: AC | PRN
  Administered 2023-02-11: 500 [IU]

## 2023-02-11 MED ORDER — SODIUM CHLORIDE 0.9 % IV SOLN: Freq: Once | INTRAVENOUS | Status: AC

## 2023-02-11 MED ORDER — SODIUM CHLORIDE 0.9% FLUSH
10.0000 mL | INTRAVENOUS | Status: DC | PRN
  Administered 2023-02-11: 10 mL

## 2023-02-11 NOTE — Patient Instructions (Signed)
Durvalumab Injection What is this medication? DURVALUMAB (dur VAL ue mab) treats some types of cancer. It works by helping your immune system slow or stop the spread of cancer cells. It is a monoclonal antibody. This medicine may be used for other purposes; ask your health care provider or pharmacist if you have questions. COMMON BRAND NAME(S): IMFINZI What should I tell my care team before I take this medication? They need to know if you have any of these conditions: Allogeneic stem cell transplant (uses someone else's stem cells) Autoimmune diseases, such as Crohn disease, ulcerative colitis, lupus History of chest radiation Nervous system problems, such as Guillain-Barre syndrome, myasthenia gravis Organ transplant An unusual or allergic reaction to durvalumab, other medications, foods, dyes, or preservatives Pregnant or trying to get pregnant Breast-feeding How should I use this medication? This medication is infused into a vein. It is given by your care team in a hospital or clinic setting. A special MedGuide will be given to you before each treatment. Be sure to read this information carefully each time. Talk to your care team about the use of this medication in children. Special care may be needed. Overdosage: If you think you have taken too much of this medicine contact a poison control center or emergency room at once. NOTE: This medicine is only for you. Do not share this medicine with others. What if I miss a dose? Keep appointments for follow-up doses. It is important not to miss your dose. Call your care team if you are unable to keep an appointment. What may interact with this medication? Interactions have not been studied. This list may not describe all possible interactions. Give your health care provider a list of all the medicines, herbs, non-prescription drugs, or dietary supplements you use. Also tell them if you smoke, drink alcohol, or use illegal drugs. Some items may  interact with your medicine. What should I watch for while using this medication? Your condition will be monitored carefully while you are receiving this medication. You may need blood work while taking this medication. This medication may cause serious skin reactions. They can happen weeks to months after starting the medication. Contact your care team right away if you notice fevers or flu-like symptoms with a rash. The rash may be red or purple and then turn into blisters or peeling of the skin. You may also notice a red rash with swelling of the face, lips, or lymph nodes in your neck or under your arms. Tell your care team right away if you have any change in your eyesight. Talk to your care team if you may be pregnant. Serious birth defects can occur if you take this medication during pregnancy and for 3 months after the last dose. You will need a negative pregnancy test before starting this medication. Contraception is recommended while taking this medication and for 3 months after the last dose. Your care team can help you find the option that works for you. Do not breastfeed while taking this medication and for 3 months after the last dose. What side effects may I notice from receiving this medication? Side effects that you should report to your care team as soon as possible: Allergic reactions--skin rash, itching, hives, swelling of the face, lips, tongue, or throat Dry cough, shortness of breath or trouble breathing Eye pain, redness, irritation, or discharge with blurry or decreased vision Heart muscle inflammation--unusual weakness or fatigue, shortness of breath, chest pain, fast or irregular heartbeat, dizziness, swelling of the   ankles, feet, or hands Hormone gland problems--headache, sensitivity to light, unusual weakness or fatigue, dizziness, fast or irregular heartbeat, increased sensitivity to cold or heat, excessive sweating, constipation, hair loss, increased thirst or amount of  urine, tremors or shaking, irritability Infusion reactions--chest pain, shortness of breath or trouble breathing, feeling faint or lightheaded Kidney injury (glomerulonephritis)--decrease in the amount of urine, red or dark brown urine, foamy or bubbly urine, swelling of the ankles, hands, or feet Liver injury--right upper belly pain, loss of appetite, nausea, light-colored stool, dark yellow or brown urine, yellowing skin or eyes, unusual weakness or fatigue Pain, tingling, or numbness in the hands or feet, muscle weakness, change in vision, confusion or trouble speaking, loss of balance or coordination, trouble walking, seizures Rash, fever, and swollen lymph nodes Redness, blistering, peeling, or loosening of the skin, including inside the mouth Sudden or severe stomach pain, bloody diarrhea, fever, nausea, vomiting Side effects that usually do not require medical attention (report these to your care team if they continue or are bothersome): Bone, joint, or muscle pain Diarrhea Fatigue Loss of appetite Nausea Skin rash This list may not describe all possible side effects. Call your doctor for medical advice about side effects. You may report side effects to FDA at 1-800-FDA-1088. Where should I keep my medication? This medication is given in a hospital or clinic. It will not be stored at home. NOTE: This sheet is a summary. It may not cover all possible information. If you have questions about this medicine, talk to your doctor, pharmacist, or health care provider.  2023 Elsevier/Gold Standard (2022-02-24 00:00:00)  

## 2023-02-11 NOTE — Progress Notes (Signed)
1045 patient arrived [facility], on oxygen at home, he is on room air here and placed him on 3L . Patients breathing is improving and HR is coming down at 125. Marlon Pel discussed symptoms with patient and he agrees to let us know if he feels any worse. Ok per susan,pharm to give his treatment today

## 2023-02-11 NOTE — Progress Notes (Signed)
Patient came into infusion today and had some shortness of breath while walking.  He normally uses O2 at home and continues to smoke heavily.  He denies chest pain and palpitations.  He does not have any problems while sitting and claims that "he gets like this sometimes."  He reports that symptoms are not any worse than his normal.  I told him to report any new changes in his breathing or o2 requirements.

## 2023-02-17 ENCOUNTER — Other Ambulatory Visit: Payer: Self-pay

## 2023-03-08 NOTE — Progress Notes (Unsigned)
[facility]  [address] [place],  [place]  [address] [phone]  Clinic Day: 03/09/2023  Referring physician: [facility]*  HISTORY OF PRESENT ILLNESS:  The patient is a 72 y.o. 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  He comes in today to be evaluated before proceeding with his 4th cycle of maintenance durvalumab immunotherapy. The patient comes into clinic today claiming to do okay.  However, he has chronic shortness of breath.  He has yet to receive his oxygen.  Of note, this gentleman looks thinner and has lost a considerable amount of weight in just the past few weeks.  He also complains of nondescript abdominal pain.  As it pertains to his lung cancer, he denies having any new findings which concern him for progression of his lung cancer.  Unfortunately, he continues to smoke on a daily basis.  PHYSICAL EXAM:  Blood pressure 106/67, pulse (!) 125, temperature 97.8 F (36.6 C), resp. rate (!) 22, height 6\' 3"  (1.905 m), weight 155 lb 3.2 oz (Allen.4 kg), SpO2 93 %. Wt Readings from Last 3 Encounters:  03/09/23 155 lb 3.2 oz (Allen.4 kg)  02/11/23 170 lb (77.1 kg)  02/09/23 171 lb 8 oz (77.8 kg)   Body mass index is 19.4 kg/m. Performance status (ECOG): 1 - Symptomatic but completely ambulatory Physical Exam Constitutional:      Appearance: Normal appearance. He is not ill-appearing.     Comments: A chronically ill-appearing older gentleman. He is wearing oxygen per nasal cannula.  He is clearly thinner, with his clothes too big for him  HENT:     Mouth/Throat:     Mouth: Mucous membranes are moist. No oral lesions.     Pharynx: Oropharynx is clear. No oropharyngeal exudate or posterior oropharyngeal erythema.  Cardiovascular:     Rate and Rhythm: Regular rhythm. Tachycardia present.     Heart sounds: No murmur heard.    No friction rub. No gallop.  Pulmonary:     Effort: Pulmonary effort is normal.  No respiratory distress.     Breath sounds: Decreased air movement present. Examination of the right-upper field reveals decreased breath sounds. Examination of the left-upper field reveals decreased breath sounds. Examination of the right-middle field reveals decreased breath sounds. Examination of the left-middle field reveals decreased breath sounds. Examination of the right-lower field reveals decreased breath sounds. Examination of the left-lower field reveals decreased breath sounds. Decreased breath sounds present. No wheezing, rhonchi or rales.  Abdominal:     General: Bowel sounds are normal. There is no distension.     Palpations: Abdomen is soft. There is no mass.     Tenderness: There is no abdominal tenderness.  Musculoskeletal:        General: No swelling.     Right lower leg: No edema.     Left lower leg: No edema.  Lymphadenopathy:     Cervical: No cervical adenopathy.     Upper Body:     Right upper body: No supraclavicular or axillary adenopathy.     Left upper body: No supraclavicular or axillary adenopathy.     Lower Body: No right inguinal adenopathy. No left inguinal adenopathy.  Skin:    General: Skin is warm.     Coloration: Skin is not jaundiced.     Findings: No lesion or rash.  Neurological:     General: No focal deficit present.     Mental Status: He is  alert and oriented to person, place, and time. Mental status is at baseline.  Psychiatric:        Mood and Affect: Mood normal.        Behavior: Behavior normal.        Thought Content: Thought content normal.    LABS:      Latest Ref Rng & Units 03/09/2023   12:00 AM 02/09/2023   12:00 AM 01/12/2023   12:00 AM  CBC  WBC  8.2     9.5     7.9      Hemoglobin 13.5 - 17.5 14.6     14.5     12.5      Hematocrit 41 - 53 44     44     38      Platelets 150 - 400 K/uL 155     188     181         This result is from an external source.      Latest Ref Rng & Units 03/09/2023    9:22 AM 02/09/2023    8:50 AM  01/12/2023    8:57 AM  CMP  Glucose Allen - 99 mg/dL 161  096  045   BUN 8 - 23 mg/dL Creatinine 0.61 - 1.24 mg/dL 4.09  8.11  9.14   Sodium 135 - 145 mmol/L 134  136  136   Potassium 3.5 - 5.1 mmol/L 4.0  4.2  4.2   Chloride 98 - 111 mmol/L 97  96  99   CO2 22 - 32 mmol/L Calcium 8.9 - 10.3 mg/dL 8.9  9.6  9.2   Total Protein 6.5 - 8.1 g/dL 7.3  7.6  7.0   Total Bilirubin 0.3 - 1.2 mg/dL 0.7  0.5  0.2   Alkaline Phos 38 - 126 U/L 95  78  51   AST 15 - 41 U/L ALT 0 - 44 U/L 32  14  15     ASSESSMENT & PLAN:  A 72 y.o. 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  He will proceed with his 4th cycle of durvalumab immunotherapy this week.  I was very concerned with this gentleman's overall health today.  He has significant weight loss and physically appears to be in a free-fall decline.  I am not convinced this is due to disease recurrence.  I am very concerned his living situation (ie. Food, finances) is what has him doing poorly.  I will have home health evaluate his living situation.  Furthermore, I discussed with our social workers the need to have adult protective services intervene.  If things continue to go the way they are, I am very concerned he may expire within the next 2-3 months.  This was voiced to his brother in private after the patient left the room.  His current living conditions will need to change if he is ever able to get better.  Due to abdominal pain, I did order abdominal films.  Nothing seemed to be particularly ominous.  It does appear he has some degree of stool impaction that could be making him uncomfortable.  I will see him back in 4 weeks before he heads into his 5th cycle of treatment.  CT scans will be done before his next visit to ascertain his new disease baseline after 4  cycles of maintenance durvalumab immunotherapy.  The patient understands all the plans discussed today and is in agreement with them.     Nakhia Levitan Kirby Funk, MD

## 2023-03-09 ENCOUNTER — Inpatient Hospital Stay: Payer: 59

## 2023-03-09 ENCOUNTER — Telehealth: Payer: Self-pay

## 2023-03-09 ENCOUNTER — Inpatient Hospital Stay: Payer: 59 | Attending: Oncology | Admitting: Oncology

## 2023-03-09 DIAGNOSIS — C3412 Malignant neoplasm of upper lobe, left bronchus or lung: Secondary | ICD-10-CM | POA: Diagnosis not present

## 2023-03-09 DIAGNOSIS — R0602 Shortness of breath: Secondary | ICD-10-CM | POA: Insufficient documentation

## 2023-03-09 DIAGNOSIS — F17210 Nicotine dependence, cigarettes, uncomplicated: Secondary | ICD-10-CM | POA: Diagnosis not present

## 2023-03-09 DIAGNOSIS — Z5112 Encounter for antineoplastic immunotherapy: Secondary | ICD-10-CM | POA: Diagnosis present

## 2023-03-09 LAB — CMP (CANCER CENTER ONLY)
ALT: 32 U/L (ref 0–44)
AST: 20 U/L (ref 15–41)
Albumin: 3.3 g/dL — ABNORMAL LOW (ref 3.5–5.0)
Alkaline Phosphatase: 95 U/L (ref 38–126)
Anion gap: 12 (ref 5–15)
BUN: 22 mg/dL (ref 8–23)
CO2: 25 mmol/L (ref 22–32)
Calcium: 8.9 mg/dL (ref 8.9–10.3)
Chloride: 97 mmol/L — ABNORMAL LOW (ref 98–111)
Creatinine: 0.82 mg/dL (ref 0.61–1.24)
GFR, Estimated: >60 mL/min (ref >60)
Glucose, Bld: 241 mg/dL — ABNORMAL HIGH (ref 70–99)
Potassium: 4 mmol/L (ref 3.5–5.1)
Sodium: 134 mmol/L — ABNORMAL LOW (ref 135–145)
Total Bilirubin: 0.7 mg/dL (ref 0.3–1.2)
Total Protein: 7.3 g/dL (ref 6.5–8.1)

## 2023-03-09 LAB — CBC: RBC: 4.75 (ref 3.87–5.11)

## 2023-03-09 LAB — CBC AND DIFFERENTIAL
HCT: 44 (ref 41–53)
Hemoglobin: 14.6 (ref 13.5–17.5)
Neutrophils Absolute: 7.63
Platelets: 155 10*3/uL (ref 150–400)
WBC: 8.2

## 2023-03-09 LAB — CBC W DIFFERENTIAL (~~LOC~~ CC SCANNED REPORT)

## 2023-03-09 MED ORDER — OXYCODONE HCL 15 MG PO TABS: ORAL_TABLET | ORAL | 0 refills | Status: DC

## 2023-03-09 NOTE — Telephone Encounter (Signed)
Pharmacy staff from [facility] called to confirm if Dr Melvyn Neth wanted the prescription for oxycodone  dispensed? Pt received monthly fill of oxycodone 10/325 1 tab 5 times per day by Donavan Burnet on 02/27/2023 qty #150.

## 2023-03-09 NOTE — Telephone Encounter (Signed)
Called and spoke with [facility] to file concern related to adequate food in the home and extreme weight loss.

## 2023-03-10 MED FILL — Durvalumab Soln for IV Infusion 500 MG/10ML (50 MG/ML): INTRAVENOUS | Qty: 30 | Status: AC

## 2023-03-11 ENCOUNTER — Other Ambulatory Visit: Payer: Self-pay

## 2023-03-11 ENCOUNTER — Inpatient Hospital Stay: Payer: 59

## 2023-03-11 VITALS — BP 99/60 | HR 116 | Temp 97.7°F | Resp 22 | Wt 150.0 lb

## 2023-03-11 DIAGNOSIS — C3412 Malignant neoplasm of upper lobe, left bronchus or lung: Secondary | ICD-10-CM

## 2023-03-11 DIAGNOSIS — Z5112 Encounter for antineoplastic immunotherapy: Secondary | ICD-10-CM | POA: Diagnosis not present

## 2023-03-11 MED ORDER — SODIUM CHLORIDE 0.9% FLUSH
10.0000 mL | INTRAVENOUS | Status: DC | PRN
  Administered 2023-03-11: 10 mL

## 2023-03-11 MED ORDER — SODIUM CHLORIDE 0.9 % IV SOLN: Freq: Once | INTRAVENOUS | Status: AC

## 2023-03-11 MED ORDER — SODIUM CHLORIDE 0.9 % IV SOLN
1500.0000 mg | Freq: Once | INTRAVENOUS | Status: AC
  Administered 2023-03-11: 1500 mg via INTRAVENOUS
  Filled 2023-03-11: qty 30

## 2023-03-11 MED ORDER — HEPARIN SOD (PORK) LOCK FLUSH 100 UNIT/ML IV SOLN
500.0000 [IU] | Freq: Once | INTRAVENOUS | Status: AC | PRN
  Administered 2023-03-11: 500 [IU]

## 2023-03-11 NOTE — Progress Notes (Signed)
Patient states that he has oxygen at home now. Bag of nonperishable foods given to patient

## 2023-03-11 NOTE — Patient Instructions (Signed)
Durvalumab Injection What is this medication? DURVALUMAB (dur VAL ue mab) treats some types of cancer. It works by helping your immune system slow or stop the spread of cancer cells. It is a monoclonal antibody. This medicine may be used for other purposes; ask your health care provider or pharmacist if you have questions. COMMON BRAND NAME(S): IMFINZI What should I tell my care team before I take this medication? They need to know if you have any of these conditions: Allogeneic stem cell transplant (uses someone else's stem cells) Autoimmune diseases, such as Crohn disease, ulcerative colitis, lupus History of chest radiation Nervous system problems, such as Guillain-Barre syndrome, myasthenia gravis Organ transplant An unusual or allergic reaction to durvalumab, other medications, foods, dyes, or preservatives Pregnant or trying to get pregnant Breast-feeding How should I use this medication? This medication is infused into a vein. It is given by your care team in a hospital or clinic setting. A special MedGuide will be given to you before each treatment. Be sure to read this information carefully each time. Talk to your care team about the use of this medication in children. Special care may be needed. Overdosage: If you think you have taken too much of this medicine contact a poison control center or emergency room at once. NOTE: This medicine is only for you. Do not share this medicine with others. What if I miss a dose? Keep appointments for follow-up doses. It is important not to miss your dose. Call your care team if you are unable to keep an appointment. What may interact with this medication? Interactions have not been studied. This list may not describe all possible interactions. Give your health care provider a list of all the medicines, herbs, non-prescription drugs, or dietary supplements you use. Also tell them if you smoke, drink alcohol, or use illegal drugs. Some items may  interact with your medicine. What should I watch for while using this medication? Your condition will be monitored carefully while you are receiving this medication. You may need blood work while taking this medication. This medication may cause serious skin reactions. They can happen weeks to months after starting the medication. Contact your care team right away if you notice fevers or flu-like symptoms with a rash. The rash may be red or purple and then turn into blisters or peeling of the skin. You may also notice a red rash with swelling of the face, lips, or lymph nodes in your neck or under your arms. Tell your care team right away if you have any change in your eyesight. Talk to your care team if you may be pregnant. Serious birth defects can occur if you take this medication during pregnancy and for 3 months after the last dose. You will need a negative pregnancy test before starting this medication. Contraception is recommended while taking this medication and for 3 months after the last dose. Your care team can help you find the option that works for you. Do not breastfeed while taking this medication and for 3 months after the last dose. What side effects may I notice from receiving this medication? Side effects that you should report to your care team as soon as possible: Allergic reactions--skin rash, itching, hives, swelling of the face, lips, tongue, or throat Dry cough, shortness of breath or trouble breathing Eye pain, redness, irritation, or discharge with blurry or decreased vision Heart muscle inflammation--unusual weakness or fatigue, shortness of breath, chest pain, fast or irregular heartbeat, dizziness, swelling of the   ankles, feet, or hands Hormone gland problems--headache, sensitivity to light, unusual weakness or fatigue, dizziness, fast or irregular heartbeat, increased sensitivity to cold or heat, excessive sweating, constipation, hair loss, increased thirst or amount of  urine, tremors or shaking, irritability Infusion reactions--chest pain, shortness of breath or trouble breathing, feeling faint or lightheaded Kidney injury (glomerulonephritis)--decrease in the amount of urine, red or dark brown urine, foamy or bubbly urine, swelling of the ankles, hands, or feet Liver injury--right upper belly pain, loss of appetite, nausea, light-colored stool, dark yellow or brown urine, yellowing skin or eyes, unusual weakness or fatigue Pain, tingling, or numbness in the hands or feet, muscle weakness, change in vision, confusion or trouble speaking, loss of balance or coordination, trouble walking, seizures Rash, fever, and swollen lymph nodes Redness, blistering, peeling, or loosening of the skin, including inside the mouth Sudden or severe stomach pain, bloody diarrhea, fever, nausea, vomiting Side effects that usually do not require medical attention (report these to your care team if they continue or are bothersome): Bone, joint, or muscle pain Diarrhea Fatigue Loss of appetite Nausea Skin rash This list may not describe all possible side effects. Call your doctor for medical advice about side effects. You may report side effects to FDA at 1-800-FDA-1088. Where should I keep my medication? This medication is given in a hospital or clinic. It will not be stored at home. NOTE: This sheet is a summary. It may not cover all possible information. If you have questions about this medicine, talk to your doctor, pharmacist, or health care provider.  2023 Elsevier/Gold Standard (2022-03-07 00:00:00)  

## 2023-03-24 ENCOUNTER — Encounter: Payer: Self-pay | Admitting: Oncology

## 2023-03-24 ENCOUNTER — Telehealth: Payer: Self-pay | Admitting: Dietician

## 2023-03-24 NOTE — Telephone Encounter (Signed)
Patient screened for weight loss. First attempt to reach. Provided my cell# on voice mail to return call to set up a nutrition consult.  Gennaro Africa, RDN, LDN Registered Dietitian, [facility] Part Time Remote (Usual office hours: Tuesday-Thursday) Cell: [phone]

## 2023-04-01 NOTE — Progress Notes (Signed)
[facility]  [address] [place],  [place]  [address] [phone]  Clinic Day:  04/02/2023  Referring physician: [facility]*  HISTORY OF PRESENT ILLNESS:  The patient is a 72 y.o. 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  He comes in today to go over his CT scans to ascertain his new disease baseline after receiving 4 cycles of maintenance durvalumab immunotherapy. The patient comes into clinic today claiming to be okay.  However, he has chronic shortness of breath.  As it pertains to his lung cancer, he denies having any new symptoms which concern him for progression of his lung cancer.  Unfortunately, he continues to smoke on a daily basis.  PHYSICAL EXAM:  Blood pressure (!) 121/58, pulse (!) 115, temperature 98.2 F (36.8 C), temperature source Oral, resp. rate (!) 22, weight 162 lb 11.2 oz (73.8 kg), SpO2 94 %. Wt Readings from Last 3 Encounters:  04/02/23 162 lb 11.2 oz (73.8 kg)  03/11/23 150 lb (68 kg)  03/09/23 155 lb 3.2 oz (70.4 kg)   Body mass index is 20.34 kg/m. Performance status (ECOG): 1 - Symptomatic but completely ambulatory Physical Exam Constitutional:      Appearance: Normal appearance. He is not ill-appearing.     Comments: A chronically ill-appearing older gentleman. He is wearing oxygen per nasal cannula.  He is clearly thinner, with his clothes too big for him  HENT:     Mouth/Throat:     Mouth: Mucous membranes are moist. No oral lesions.     Pharynx: Oropharynx is clear. No oropharyngeal exudate or posterior oropharyngeal erythema.  Cardiovascular:     Rate and Rhythm: Regular rhythm. Tachycardia present.     Heart sounds: No murmur heard.    No friction rub. No gallop.  Pulmonary:     Effort: Pulmonary effort is normal. No respiratory distress.     Breath sounds: Decreased air movement present. Examination of the right-upper field reveals decreased breath sounds.  Examination of the left-upper field reveals decreased breath sounds. Examination of the right-middle field reveals decreased breath sounds. Examination of the left-middle field reveals decreased breath sounds. Examination of the right-lower field reveals decreased breath sounds. Examination of the left-lower field reveals decreased breath sounds. Decreased breath sounds present. No wheezing, rhonchi or rales.  Abdominal:     General: Bowel sounds are normal. There is no distension.     Palpations: Abdomen is soft. There is no mass.     Tenderness: There is no abdominal tenderness.  Musculoskeletal:        General: No swelling.     Right lower leg: No edema.     Left lower leg: No edema.  Lymphadenopathy:     Cervical: No cervical adenopathy.     Upper Body:     Right upper body: No supraclavicular or axillary adenopathy.     Left upper body: No supraclavicular or axillary adenopathy.     Lower Body: No right inguinal adenopathy. No left inguinal adenopathy.  Skin:    General: Skin is warm.     Coloration: Skin is not jaundiced.     Findings: No lesion or rash.  Neurological:     General: No focal deficit present.     Mental Status: He is alert and oriented to person, place, and time. Mental status is at baseline.  Psychiatric:        Mood and Affect: Mood normal.  Behavior: Behavior normal.        Thought Content: Thought content normal.    SCANS:  His chest CT revealed the following: FINDINGS: Cardiovascular: Thoracic aorta demonstrates atherosclerotic calcifications without aneurysmal dilatation or dissection. Coronary calcifications are seen. No cardiac enlargement is noted. The pulmonary artery shows a normal branching pattern without evidence of intraluminal filling defect to suggest pulmonary embolism.  Mediastinum/Nodes: Thoracic inlet is within normal limits. The esophagus demonstrates diffuse thickening throughout its course. These changes may be related to mild  reflux. No hilar or mediastinal adenopathy is noted.  Lungs/Pleura: Significant architectural distortion is noted in the lung centrally extending into the left upper and left lower lobes. The overall appearance is similar to that seen on the prior exam. Stable cavitary lesion adjacent to the left hilum is noted best seen on image number 43 of series 14. Diffuse emphysematous changes are identified. Some reticulonodular changes are noted within the right lung slightly progressed in the interval from the prior exam. Spiculated area is noted in the superior segment of the right lower lobe measuring up to 19 mm. This is roughly similar in appearance to that seen on the prior exam. No sizable effusion or pneumothorax is noted.  Upper Abdomen: Visualized upper abdomen is within normal limits.  Musculoskeletal: Degenerative changes of the thoracic spine are noted. No acute rib abnormality is noted. T8 compression fracture is noted increased when compared with the prior exam.  Review of the MIP images confirms the above findings.  IMPRESSION: Significant areas of architectural distortion within the left lung similar to that seen on the prior exam.  Somewhat spiculated right lower lobe lesion is noted overall stable in appearance from the recent CT examination. This is consistent with the given clinical history.  Increase in T8 compression fracture when compared with the prior exam.  No evidence of pulmonary emboli.  Aortic Atherosclerosis (ICD10-I70.0) and Emphysema (ICD10-J43.9).  LABS:      Latest Ref Rng & Units 03/09/2023   12:00 AM 02/09/2023   12:00 AM 01/12/2023   12:00 AM  CBC  WBC  8.2     9.5     7.9      Hemoglobin 13.5 - 17.5 14.6     14.5     12.5      Hematocrit 41 - 53 44     44     38      Platelets 150 - 400 K/uL 155     188     181         This result is from an external source.      Latest Ref Rng & Units 03/09/2023    9:22 AM 02/09/2023    8:50 AM  01/12/2023    8:57 AM  CMP  Glucose 70 - 99 mg/dL 161  096  045   BUN 8 - 23 mg/dL 22  15  20    Creatinine 0.61 - 1.24 mg/dL 4.09  8.11  9.14   Sodium 135 - 145 mmol/L 134  136  136   Potassium 3.5 - 5.1 mmol/L 4.0  4.2  4.2   Chloride 98 - 111 mmol/L 97  96  99   CO2 22 - 32 mmol/L 25  31  25    Calcium 8.9 - 10.3 mg/dL 8.9  9.6  9.2   Total Protein 6.5 - 8.1 g/dL 7.3  7.6  7.0   Total Bilirubin 0.3 - 1.2 mg/dL 0.7  0.5  0.2  Alkaline Phos 38 - 126 U/L 95  78  51   AST 15 - 41 U/L 20  12  23    ALT 0 - 44 U/L 32  14  15     ASSESSMENT & PLAN:  A 72 y.o. 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  In clinic today, I went over his chest CT images with him, for which he could see that his left upper lung is severely damaged.  Although I see no obvious evidence of recurrent lung cancer, his lung tissue in this region is severely damaged.  If he continues to smoke and develops any type of airway infection, it could prove fatal.  For now, he will proceed with his 5th cycle of durvalumab immunotherapy this week. I will see him back in 4 weeks before he heads into his 6th cycle of treatment.  The patient understands all the plans discussed today and is in agreement with them.    Liset Mcmonigle Kirby Funk, MD

## 2023-04-02 ENCOUNTER — Inpatient Hospital Stay: Payer: 59 | Attending: Oncology | Admitting: Oncology

## 2023-04-02 DIAGNOSIS — C3412 Malignant neoplasm of upper lobe, left bronchus or lung: Secondary | ICD-10-CM | POA: Insufficient documentation

## 2023-04-02 DIAGNOSIS — F17210 Nicotine dependence, cigarettes, uncomplicated: Secondary | ICD-10-CM | POA: Insufficient documentation

## 2023-04-02 DIAGNOSIS — Z5112 Encounter for antineoplastic immunotherapy: Secondary | ICD-10-CM | POA: Insufficient documentation

## 2023-04-07 ENCOUNTER — Other Ambulatory Visit: Payer: Self-pay

## 2023-04-07 ENCOUNTER — Inpatient Hospital Stay: Payer: 59

## 2023-04-07 ENCOUNTER — Encounter: Payer: Self-pay | Admitting: Oncology

## 2023-04-07 DIAGNOSIS — C3412 Malignant neoplasm of upper lobe, left bronchus or lung: Secondary | ICD-10-CM

## 2023-04-07 LAB — BASIC METABOLIC PANEL
BUN: 17 (ref 4–21)
CO2: 29 — AB (ref 13–22)
Chloride: 102 (ref 99–108)
Creatinine: 0.5 — AB (ref 0.6–1.3)
Glucose: 108
Potassium: 4.2 mEq/L (ref 3.5–5.1)
Sodium: 136 — AB (ref 137–147)

## 2023-04-07 LAB — CBC AND DIFFERENTIAL
HCT: 43 (ref 41–53)
Hemoglobin: 14.3 (ref 13.5–17.5)
Neutrophils Absolute: 7.22
Platelets: 185 10*3/uL (ref 150–400)
WBC: 8.2

## 2023-04-07 LAB — COMPREHENSIVE METABOLIC PANEL
Albumin: 3.9 (ref 3.5–5.0)
Calcium: 9.4 (ref 8.7–10.7)

## 2023-04-07 LAB — HEPATIC FUNCTION PANEL
ALT: 16 U/L (ref 10–40)
AST: 16 (ref 14–40)
Alkaline Phosphatase: 79 (ref 25–125)
Bilirubin, Total: 0.4

## 2023-04-07 LAB — CBC: RBC: 4.57 (ref 3.87–5.11)

## 2023-04-08 ENCOUNTER — Inpatient Hospital Stay: Payer: 59

## 2023-04-08 VITALS — BP 120/78 | HR 98 | Temp 97.9°F | Resp 21 | Ht 75.0 in | Wt 160.0 lb

## 2023-04-08 DIAGNOSIS — Z5112 Encounter for antineoplastic immunotherapy: Secondary | ICD-10-CM | POA: Diagnosis present

## 2023-04-08 DIAGNOSIS — C3412 Malignant neoplasm of upper lobe, left bronchus or lung: Secondary | ICD-10-CM | POA: Diagnosis present

## 2023-04-08 DIAGNOSIS — F17210 Nicotine dependence, cigarettes, uncomplicated: Secondary | ICD-10-CM | POA: Diagnosis not present

## 2023-04-08 MED ORDER — SODIUM CHLORIDE 0.9 % IV SOLN: Freq: Once | INTRAVENOUS | Status: AC

## 2023-04-08 MED ORDER — SODIUM CHLORIDE 0.9 % IV SOLN
1500.0000 mg | Freq: Once | INTRAVENOUS | Status: AC
  Administered 2023-04-08: 1500 mg via INTRAVENOUS
  Filled 2023-04-08: qty 30

## 2023-04-08 MED ORDER — SODIUM CHLORIDE 0.9% FLUSH
10.0000 mL | INTRAVENOUS | Status: DC | PRN
  Administered 2023-04-08: 10 mL

## 2023-04-08 MED ORDER — HEPARIN SOD (PORK) LOCK FLUSH 100 UNIT/ML IV SOLN
500.0000 [IU] | Freq: Once | INTRAVENOUS | Status: AC | PRN
  Administered 2023-04-08: 500 [IU]

## 2023-04-08 NOTE — Patient Instructions (Signed)
Durvalumab Injection What is this medication? DURVALUMAB (dur VAL ue mab) treats some types of cancer. It works by helping your immune system slow or stop the spread of cancer cells. It is a monoclonal antibody. This medicine may be used for other purposes; ask your health care provider or pharmacist if you have questions. COMMON BRAND NAME(S): IMFINZI What should I tell my care team before I take this medication? They need to know if you have any of these conditions: Allogeneic stem cell transplant (uses someone else's stem cells) Autoimmune diseases, such as Crohn disease, ulcerative colitis, lupus History of chest radiation Nervous system problems, such as Guillain-Barre syndrome, myasthenia gravis Organ transplant An unusual or allergic reaction to durvalumab, other medications, foods, dyes, or preservatives Pregnant or trying to get pregnant Breast-feeding How should I use this medication? This medication is infused into a vein. It is given by your care team in a hospital or clinic setting. A special MedGuide will be given to you before each treatment. Be sure to read this information carefully each time. Talk to your care team about the use of this medication in children. Special care may be needed. Overdosage: If you think you have taken too much of this medicine contact a poison control center or emergency room at once. NOTE: This medicine is only for you. Do not share this medicine with others. What if I miss a dose? Keep appointments for follow-up doses. It is important not to miss your dose. Call your care team if you are unable to keep an appointment. What may interact with this medication? Interactions have not been studied. This list may not describe all possible interactions. Give your health care provider a list of all the medicines, herbs, non-prescription drugs, or dietary supplements you use. Also tell them if you smoke, drink alcohol, or use illegal drugs. Some items may  interact with your medicine. What should I watch for while using this medication? Your condition will be monitored carefully while you are receiving this medication. You may need blood work while taking this medication. This medication may cause serious skin reactions. They can happen weeks to months after starting the medication. Contact your care team right away if you notice fevers or flu-like symptoms with a rash. The rash may be red or purple and then turn into blisters or peeling of the skin. You may also notice a red rash with swelling of the face, lips, or lymph nodes in your neck or under your arms. Tell your care team right away if you have any change in your eyesight. Talk to your care team if you may be pregnant. Serious birth defects can occur if you take this medication during pregnancy and for 3 months after the last dose. You will need a negative pregnancy test before starting this medication. Contraception is recommended while taking this medication and for 3 months after the last dose. Your care team can help you find the option that works for you. Do not breastfeed while taking this medication and for 3 months after the last dose. What side effects may I notice from receiving this medication? Side effects that you should report to your care team as soon as possible: Allergic reactions--skin rash, itching, hives, swelling of the face, lips, tongue, or throat Dry cough, shortness of breath or trouble breathing Eye pain, redness, irritation, or discharge with blurry or decreased vision Heart muscle inflammation--unusual weakness or fatigue, shortness of breath, chest pain, fast or irregular heartbeat, dizziness, swelling of the   ankles, feet, or hands Hormone gland problems--headache, sensitivity to light, unusual weakness or fatigue, dizziness, fast or irregular heartbeat, increased sensitivity to cold or heat, excessive sweating, constipation, hair loss, increased thirst or amount of  urine, tremors or shaking, irritability Infusion reactions--chest pain, shortness of breath or trouble breathing, feeling faint or lightheaded Kidney injury (glomerulonephritis)--decrease in the amount of urine, red or dark brown urine, foamy or bubbly urine, swelling of the ankles, hands, or feet Liver injury--right upper belly pain, loss of appetite, nausea, light-colored stool, dark yellow or brown urine, yellowing skin or eyes, unusual weakness or fatigue Pain, tingling, or numbness in the hands or feet, muscle weakness, change in vision, confusion or trouble speaking, loss of balance or coordination, trouble walking, seizures Rash, fever, and swollen lymph nodes Redness, blistering, peeling, or loosening of the skin, including inside the mouth Sudden or severe stomach pain, bloody diarrhea, fever, nausea, vomiting Side effects that usually do not require medical attention (report these to your care team if they continue or are bothersome): Bone, joint, or muscle pain Diarrhea Fatigue Loss of appetite Nausea Skin rash This list may not describe all possible side effects. Call your doctor for medical advice about side effects. You may report side effects to FDA at 1-800-FDA-1088. Where should I keep my medication? This medication is given in a hospital or clinic. It will not be stored at home. NOTE: This sheet is a summary. It may not cover all possible information. If you have questions about this medicine, talk to your doctor, pharmacist, or health care provider.  2023 Elsevier/Gold Standard (2022-03-07 00:00:00)  

## 2023-05-03 NOTE — Progress Notes (Unsigned)
[facility]  [address] [place],  [place]  [address] [phone]  Clinic Day:  05/04/2023  Referring physician: Weston Settle, MD  HISTORY OF PRESENT ILLNESS:  The patient is a 72 y.o. 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  He comes in today to be evaluated before heading into his 6th cycle of maintenance durvalumab immunotherapy. The patient comes into clinic today now with portable oxygen.  As it pertains to his lung cancer, he denies having any new symptoms which concern him for progression of his lung cancer.  Unfortunately, he continues to smoke on a daily basis.  PHYSICAL EXAM:  Blood pressure 111/60, pulse (!) 127, temperature 98.3 F (36.8 C), resp. rate 20, height 6\' 3"  (1.905 m), weight 157 lb 12.8 oz (71.6 kg), SpO2 95 %. Wt Readings from Last 3 Encounters:  05/04/23 157 lb 12.8 oz (71.6 kg)  04/08/23 160 lb 0.6 oz (72.6 kg)  04/02/23 162 lb 11.2 oz (73.8 kg)   Body mass index is 19.72 kg/m. Performance status (ECOG): 1 - Symptomatic but completely ambulatory Physical Exam Constitutional:      Appearance: Normal appearance. He is not ill-appearing.     Comments: A chronically ill-appearing older gentleman. He is wearing oxygen per nasal cannula.  He is clearly thinner, with his clothes too big for him  HENT:     Mouth/Throat:     Mouth: Mucous membranes are moist. No oral lesions.     Pharynx: Oropharynx is clear. No oropharyngeal exudate or posterior oropharyngeal erythema.  Cardiovascular:     Rate and Rhythm: Regular rhythm. Tachycardia present.     Heart sounds: No murmur heard.    No friction rub. No gallop.  Pulmonary:     Effort: Pulmonary effort is normal. No respiratory distress.     Breath sounds: Decreased air movement present. Examination of the right-upper field reveals decreased breath sounds. Examination of the left-upper field reveals decreased breath sounds. Examination of the  right-middle field reveals decreased breath sounds. Examination of the left-middle field reveals decreased breath sounds. Examination of the right-lower field reveals decreased breath sounds. Examination of the left-lower field reveals decreased breath sounds. Decreased breath sounds present. No wheezing, rhonchi or rales.  Abdominal:     General: Bowel sounds are normal. There is no distension.     Palpations: Abdomen is soft. There is no mass.     Tenderness: There is no abdominal tenderness.  Musculoskeletal:        General: No swelling.     Right lower leg: No edema.     Left lower leg: No edema.  Lymphadenopathy:     Cervical: No cervical adenopathy.     Upper Body:     Right upper body: No supraclavicular or axillary adenopathy.     Left upper body: No supraclavicular or axillary adenopathy.     Lower Body: No right inguinal adenopathy. No left inguinal adenopathy.  Skin:    General: Skin is warm.     Coloration: Skin is not jaundiced.     Findings: No lesion or rash.  Neurological:     General: No focal deficit present.     Mental Status: He is alert and oriented to person, place, and time. Mental status is at baseline.  Psychiatric:        Mood and Affect: Mood normal.        Behavior: Behavior normal.  Thought Content: Thought content normal.    LABS:      Latest Ref Rng & Units 05/04/2023   12:00 AM 04/07/2023   12:00 AM 03/09/2023   12:00 AM  CBC  WBC  7.8     8.2     8.2      Hemoglobin 13.5 - 17.5 14.1     14.3     14.6      Hematocrit 41 - 53 42     43     44      Platelets 150 - 400 K/uL 182     185     155         This result is from an external source.      Latest Ref Rng & Units 05/04/2023    8:59 AM 04/07/2023   12:00 AM 03/09/2023    9:22 AM  CMP  Glucose 70 - 99 mg/dL 161   096   BUN 8 - 23 mg/dL 19  17     22    Creatinine 0.61 - 1.24 mg/dL 0.45  0.5     4.09   Sodium 135 - 145 mmol/L 136  136     134   Potassium 3.5 - 5.1 mmol/L 4.7  4.2      4.0   Chloride 98 - 111 mmol/L 97  102     97   CO2 22 - 32 mmol/L 25  29     25    Calcium 8.9 - 10.3 mg/dL 9.1  9.4     8.9   Total Protein 6.5 - 8.1 g/dL 7.2   7.3   Total Bilirubin 0.3 - 1.2 mg/dL 0.8   0.7   Alkaline Phos 38 - 126 U/L 76  79     95   AST 15 - 41 U/L 17  16     20    ALT 0 - 44 U/L 19  16     32      This result is from an external source.    ASSESSMENT & PLAN:  A 72 y.o. 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  He will proceed with his 6th cycle of durvalumab immunotherapy this week.  Overall, he appears to be doing okay.  I will see him back in 4 weeks before he heads into his tth cycle of treatment.  The patient understands all the plans discussed today and is in agreement with them.    Jadalynn Burr Kirby Funk, MD

## 2023-05-04 ENCOUNTER — Other Ambulatory Visit: Payer: Self-pay | Admitting: Oncology

## 2023-05-04 ENCOUNTER — Inpatient Hospital Stay: Payer: 59

## 2023-05-04 ENCOUNTER — Inpatient Hospital Stay: Payer: 59 | Attending: Oncology | Admitting: Oncology

## 2023-05-04 DIAGNOSIS — Z5112 Encounter for antineoplastic immunotherapy: Secondary | ICD-10-CM | POA: Diagnosis present

## 2023-05-04 DIAGNOSIS — C3412 Malignant neoplasm of upper lobe, left bronchus or lung: Secondary | ICD-10-CM

## 2023-05-04 DIAGNOSIS — Z79620 Long term (current) use of immunosuppressive biologic: Secondary | ICD-10-CM | POA: Diagnosis not present

## 2023-05-04 DIAGNOSIS — F17210 Nicotine dependence, cigarettes, uncomplicated: Secondary | ICD-10-CM | POA: Diagnosis not present

## 2023-05-04 LAB — CMP (CANCER CENTER ONLY)
ALT: 19 U/L (ref 0–44)
AST: 17 U/L (ref 15–41)
Albumin: 3.4 g/dL — ABNORMAL LOW (ref 3.5–5.0)
Alkaline Phosphatase: 76 U/L (ref 38–126)
Anion gap: 14 (ref 5–15)
BUN: 19 mg/dL (ref 8–23)
CO2: 25 mmol/L (ref 22–32)
Calcium: 9.1 mg/dL (ref 8.9–10.3)
Chloride: 97 mmol/L — ABNORMAL LOW (ref 98–111)
Creatinine: 0.91 mg/dL (ref 0.61–1.24)
GFR, Estimated: >60 mL/min (ref >60)
Glucose, Bld: 354 mg/dL — ABNORMAL HIGH (ref 70–99)
Potassium: 4.7 mmol/L (ref 3.5–5.1)
Sodium: 136 mmol/L (ref 135–145)
Total Bilirubin: 0.8 mg/dL (ref 0.3–1.2)
Total Protein: 7.2 g/dL (ref 6.5–8.1)

## 2023-05-04 LAB — CBC AND DIFFERENTIAL
HCT: 42 (ref 41–53)
Hemoglobin: 14.1 (ref 13.5–17.5)
Neutrophils Absolute: 7.49
Platelets: 182 10*3/uL (ref 150–400)
WBC: 7.8

## 2023-05-04 LAB — CBC W DIFFERENTIAL (~~LOC~~ CC SCANNED REPORT)

## 2023-05-04 LAB — CBC: RBC: 4.44 (ref 3.87–5.11)

## 2023-05-04 LAB — TSH: TSH: 0.843 u[IU]/mL (ref 0.350–4.500)

## 2023-05-04 MED ORDER — OXYCODONE HCL 5 MG PO TABS: 5.0000 mg | ORAL_TABLET | ORAL | 0 refills | Status: DC | PRN

## 2023-05-04 MED ORDER — OXYCODONE HCL 5 MG PO TABS: 5.0000 mg | ORAL_TABLET | Freq: Four times a day (QID) | ORAL | 0 refills | Status: DC | PRN

## 2023-05-05 ENCOUNTER — Other Ambulatory Visit: Payer: Self-pay

## 2023-05-06 ENCOUNTER — Inpatient Hospital Stay: Payer: 59

## 2023-05-06 VITALS — BP 116/70 | HR 107 | Resp 20 | Ht 75.0 in | Wt 164.1 lb

## 2023-05-06 DIAGNOSIS — Z5112 Encounter for antineoplastic immunotherapy: Secondary | ICD-10-CM | POA: Diagnosis not present

## 2023-05-06 DIAGNOSIS — C3412 Malignant neoplasm of upper lobe, left bronchus or lung: Secondary | ICD-10-CM

## 2023-05-06 LAB — T4: T4, Total: 5.5 ug/dL (ref 4.5–12.0)

## 2023-05-06 MED ORDER — SODIUM CHLORIDE 0.9 % IV SOLN: Freq: Once | INTRAVENOUS | Status: AC

## 2023-05-06 MED ORDER — SODIUM CHLORIDE 0.9 % IV SOLN
1500.0000 mg | Freq: Once | INTRAVENOUS | Status: AC
  Administered 2023-05-06: 1500 mg via INTRAVENOUS
  Filled 2023-05-06: qty 30

## 2023-05-06 NOTE — Patient Instructions (Signed)
Durvalumab Injection What is this medication? DURVALUMAB (dur VAL ue mab) treats some types of cancer. It works by helping your immune system slow or stop the spread of cancer cells. It is a monoclonal antibody. This medicine may be used for other purposes; ask your health care provider or pharmacist if you have questions. COMMON BRAND NAME(S): IMFINZI What should I tell my care team before I take this medication? They need to know if you have any of these conditions: Allogeneic stem cell transplant (uses someone else's stem cells) Autoimmune diseases, such as Crohn disease, ulcerative colitis, lupus History of chest radiation Nervous system problems, such as Guillain-Barre syndrome, myasthenia gravis Organ transplant An unusual or allergic reaction to durvalumab, other medications, foods, dyes, or preservatives Pregnant or trying to get pregnant Breast-feeding How should I use this medication? This medication is infused into a vein. It is given by your care team in a hospital or clinic setting. A special MedGuide will be given to you before each treatment. Be sure to read this information carefully each time. Talk to your care team about the use of this medication in children. Special care may be needed. Overdosage: If you think you have taken too much of this medicine contact a poison control center or emergency room at once. NOTE: This medicine is only for you. Do not share this medicine with others. What if I miss a dose? Keep appointments for follow-up doses. It is important not to miss your dose. Call your care team if you are unable to keep an appointment. What may interact with this medication? Interactions have not been studied. This list may not describe all possible interactions. Give your health care provider a list of all the medicines, herbs, non-prescription drugs, or dietary supplements you use. Also tell them if you smoke, drink alcohol, or use illegal drugs. Some items may  interact with your medicine. What should I watch for while using this medication? Your condition will be monitored carefully while you are receiving this medication. You may need blood work while taking this medication. This medication may cause serious skin reactions. They can happen weeks to months after starting the medication. Contact your care team right away if you notice fevers or flu-like symptoms with a rash. The rash may be red or purple and then turn into blisters or peeling of the skin. You may also notice a red rash with swelling of the face, lips, or lymph nodes in your neck or under your arms. Tell your care team right away if you have any change in your eyesight. Talk to your care team if you may be pregnant. Serious birth defects can occur if you take this medication during pregnancy and for 3 months after the last dose. You will need a negative pregnancy test before starting this medication. Contraception is recommended while taking this medication and for 3 months after the last dose. Your care team can help you find the option that works for you. Do not breastfeed while taking this medication and for 3 months after the last dose. What side effects may I notice from receiving this medication? Side effects that you should report to your care team as soon as possible: Allergic reactions--skin rash, itching, hives, swelling of the face, lips, tongue, or throat Dry cough, shortness of breath or trouble breathing Eye pain, redness, irritation, or discharge with blurry or decreased vision Heart muscle inflammation--unusual weakness or fatigue, shortness of breath, chest pain, fast or irregular heartbeat, dizziness, swelling of the   ankles, feet, or hands Hormone gland problems--headache, sensitivity to light, unusual weakness or fatigue, dizziness, fast or irregular heartbeat, increased sensitivity to cold or heat, excessive sweating, constipation, hair loss, increased thirst or amount of  urine, tremors or shaking, irritability Infusion reactions--chest pain, shortness of breath or trouble breathing, feeling faint or lightheaded Kidney injury (glomerulonephritis)--decrease in the amount of urine, red or dark brown urine, foamy or bubbly urine, swelling of the ankles, hands, or feet Liver injury--right upper belly pain, loss of appetite, nausea, light-colored stool, dark yellow or brown urine, yellowing skin or eyes, unusual weakness or fatigue Pain, tingling, or numbness in the hands or feet, muscle weakness, change in vision, confusion or trouble speaking, loss of balance or coordination, trouble walking, seizures Rash, fever, and swollen lymph nodes Redness, blistering, peeling, or loosening of the skin, including inside the mouth Sudden or severe stomach pain, bloody diarrhea, fever, nausea, vomiting Side effects that usually do not require medical attention (report these to your care team if they continue or are bothersome): Bone, joint, or muscle pain Diarrhea Fatigue Loss of appetite Nausea Skin rash This list may not describe all possible side effects. Call your doctor for medical advice about side effects. You may report side effects to FDA at 1-800-FDA-1088. Where should I keep my medication? This medication is given in a hospital or clinic. It will not be stored at home. NOTE: This sheet is a summary. It may not cover all possible information. If you have questions about this medicine, talk to your doctor, pharmacist, or health care provider.  2024 Elsevier/Gold Standard (2022-03-18 00:00:00)  

## 2023-05-31 NOTE — Progress Notes (Deleted)
[facility]  [address] [place],  [place]  [address] [phone]  Clinic Day:  05/04/2023  Referring physician: Weston Settle, MD  HISTORY OF PRESENT ILLNESS:  The patient is a 72 y.o. 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  He comes in today to be evaluated before heading into his 7th cycle of maintenance durvalumab immunotherapy. The patient comes into clinic today now with portable oxygen.  As it pertains to his lung cancer, he denies having any new symptoms which concern him for progression of his lung cancer.  Unfortunately, he continues to smoke on a daily basis.  PHYSICAL EXAM:  There were no vitals taken for this visit. Wt Readings from Last 3 Encounters:  05/06/23 164 lb 1.9 oz (74.4 kg)  05/04/23 157 lb 12.8 oz (71.6 kg)  04/08/23 160 lb 0.6 oz (72.6 kg)   There is no height or weight on file to calculate BMI. Performance status (ECOG): 1 - Symptomatic but completely ambulatory Physical Exam Constitutional:      Appearance: Normal appearance. He is not ill-appearing.     Comments: A chronically ill-appearing older gentleman. He is wearing oxygen per nasal cannula.  He is clearly thinner, with his clothes too big for him  HENT:     Mouth/Throat:     Mouth: Mucous membranes are moist. No oral lesions.     Pharynx: Oropharynx is clear. No oropharyngeal exudate or posterior oropharyngeal erythema.  Cardiovascular:     Rate and Rhythm: Regular rhythm. Tachycardia present.     Heart sounds: No murmur heard.    No friction rub. No gallop.  Pulmonary:     Effort: Pulmonary effort is normal. No respiratory distress.     Breath sounds: Decreased air movement present. Examination of the right-upper field reveals decreased breath sounds. Examination of the left-upper field reveals decreased breath sounds. Examination of the right-middle field reveals decreased breath sounds. Examination of the left-middle  field reveals decreased breath sounds. Examination of the right-lower field reveals decreased breath sounds. Examination of the left-lower field reveals decreased breath sounds. Decreased breath sounds present. No wheezing, rhonchi or rales.  Abdominal:     General: Bowel sounds are normal. There is no distension.     Palpations: Abdomen is soft. There is no mass.     Tenderness: There is no abdominal tenderness.  Musculoskeletal:        General: No swelling.     Right lower leg: No edema.     Left lower leg: No edema.  Lymphadenopathy:     Cervical: No cervical adenopathy.     Upper Body:     Right upper body: No supraclavicular or axillary adenopathy.     Left upper body: No supraclavicular or axillary adenopathy.     Lower Body: No right inguinal adenopathy. No left inguinal adenopathy.  Skin:    General: Skin is warm.     Coloration: Skin is not jaundiced.     Findings: No lesion or rash.  Neurological:     General: No focal deficit present.     Mental Status: He is alert and oriented to person, place, and time. Mental status is at baseline.  Psychiatric:        Mood and Affect: Mood normal.        Behavior: Behavior normal.        Thought Content: Thought content normal.    LABS:  Latest Ref Rng & Units 05/04/2023   12:00 AM 04/07/2023   12:00 AM 03/09/2023   12:00 AM  CBC  WBC  7.8     8.2     8.2      Hemoglobin 13.5 - 17.5 14.1     14.3     14.6      Hematocrit 41 - 53 42     43     44      Platelets 150 - 400 K/uL 182     185     155         This result is from an external source.      Latest Ref Rng & Units 05/04/2023    8:59 AM 04/07/2023   12:00 AM 03/09/2023    9:22 AM  CMP  Glucose 70 - 99 mg/dL 161   096   BUN 8 - 23 mg/dL 19  17     22    Creatinine 0.61 - 1.24 mg/dL 0.45  0.5     4.09   Sodium 135 - 145 mmol/L 136  136     134   Potassium 3.5 - 5.1 mmol/L 4.7  4.2     4.0   Chloride 98 - 111 mmol/L 97  102     97   CO2 22 - 32 mmol/L 25  29     25     Calcium 8.9 - 10.3 mg/dL 9.1  9.4     8.9   Total Protein 6.5 - 8.1 g/dL 7.2   7.3   Total Bilirubin 0.3 - 1.2 mg/dL 0.8   0.7   Alkaline Phos 38 - 126 U/L 76  79     95   AST 15 - 41 U/L 17  16     20    ALT 0 - 44 U/L 19  16     32      This result is from an external source.    ASSESSMENT & PLAN:  A 72 y.o. 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  He will proceed with his 6th cycle of durvalumab immunotherapy this week.  Overall, he appears to be doing okay.  I will see him back in 4 weeks before he heads into his tth cycle of treatment.  The patient understands all the plans discussed today and is in agreement with them.     Kirby Funk, MD

## 2023-06-01 ENCOUNTER — Other Ambulatory Visit: Payer: 59

## 2023-06-01 ENCOUNTER — Ambulatory Visit: Payer: 59 | Admitting: Oncology

## 2023-06-01 ENCOUNTER — Encounter: Payer: Self-pay | Admitting: Oncology

## 2023-06-01 ENCOUNTER — Inpatient Hospital Stay: Payer: 59

## 2023-06-01 NOTE — Progress Notes (Unsigned)
 [facility]  [address] [place],  [place]  [address] [phone]  Clinic Day:  05/04/2023  Referring physician: Weston Settle, MD  HISTORY OF PRESENT ILLNESS:  The patient is a 72 y.o. 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  He comes in today to be evaluated before heading into his 7th cycle of maintenance durvalumab immunotherapy. The patient comes into clinic today now with portable oxygen.  As it pertains to his lung cancer, he denies having any new symptoms which concern him for progression of his lung cancer.  Unfortunately, he continues to smoke on a daily basis.  PHYSICAL EXAM:  There were no vitals taken for this visit. Wt Readings from Last 3 Encounters:  05/06/23 164 lb 1.9 oz (74.4 kg)  05/04/23 157 lb 12.8 oz (71.6 kg)  04/08/23 160 lb 0.6 oz (72.6 kg)   There is no height or weight on file to calculate BMI. Performance status (ECOG): 1 - Symptomatic but completely ambulatory Physical Exam Constitutional:      Appearance: Normal appearance. He is not ill-appearing.     Comments: A chronically ill-appearing older gentleman. He is wearing oxygen per nasal cannula.  He is clearly thinner, with his clothes too big for him  HENT:     Mouth/Throat:     Mouth: Mucous membranes are moist. No oral lesions.     Pharynx: Oropharynx is clear. No oropharyngeal exudate or posterior oropharyngeal erythema.  Cardiovascular:     Rate and Rhythm: Regular rhythm. Tachycardia present.     Heart sounds: No murmur heard.    No friction rub. No gallop.  Pulmonary:     Effort: Pulmonary effort is normal. No respiratory distress.     Breath sounds: Decreased air movement present. Examination of the right-upper field reveals decreased breath sounds. Examination of the left-upper field reveals decreased breath sounds. Examination of the right-middle field reveals decreased breath sounds. Examination of the left-middle  field reveals decreased breath sounds. Examination of the right-lower field reveals decreased breath sounds. Examination of the left-lower field reveals decreased breath sounds. Decreased breath sounds present. No wheezing, rhonchi or rales.  Abdominal:     General: Bowel sounds are normal. There is no distension.     Palpations: Abdomen is soft. There is no mass.     Tenderness: There is no abdominal tenderness.  Musculoskeletal:        General: No swelling.     Right lower leg: No edema.     Left lower leg: No edema.  Lymphadenopathy:     Cervical: No cervical adenopathy.     Upper Body:     Right upper body: No supraclavicular or axillary adenopathy.     Left upper body: No supraclavicular or axillary adenopathy.     Lower Body: No right inguinal adenopathy. No left inguinal adenopathy.  Skin:    General: Skin is warm.     Coloration: Skin is not jaundiced.     Findings: No lesion or rash.  Neurological:     General: No focal deficit present.     Mental Status: He is alert and oriented to person, place, and time. Mental status is at baseline.  Psychiatric:        Mood and Affect: Mood normal.        Behavior: Behavior normal.        Thought Content: Thought content normal.    LABS:  Latest Ref Rng & Units 05/04/2023   12:00 AM 04/07/2023   12:00 AM 03/09/2023   12:00 AM  CBC  WBC  7.8     8.2     8.2      Hemoglobin 13.5 - 17.5 14.1     14.3     14.6      Hematocrit 41 - 53 42     43     44      Platelets 150 - 400 K/uL 182     185     155         This result is from an external source.      Latest Ref Rng & Units 05/04/2023    8:59 AM 04/07/2023   12:00 AM 03/09/2023    9:22 AM  CMP  Glucose 70 - 99 mg/dL 161   096   BUN 8 - 23 mg/dL 19  17     22    Creatinine 0.61 - 1.24 mg/dL 0.45  0.5     4.09   Sodium 135 - 145 mmol/L 136  136     134   Potassium 3.5 - 5.1 mmol/L 4.7  4.2     4.0   Chloride 98 - 111 mmol/L 97  102     97   CO2 22 - 32 mmol/L 25  29     25     Calcium 8.9 - 10.3 mg/dL 9.1  9.4     8.9   Total Protein 6.5 - 8.1 g/dL 7.2   7.3   Total Bilirubin 0.3 - 1.2 mg/dL 0.8   0.7   Alkaline Phos 38 - 126 U/L 76  79     95   AST 15 - 41 U/L 17  16     20    ALT 0 - 44 U/L 19  16     32      This result is from an external source.    ASSESSMENT & PLAN:  A 72 y.o. 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  He will proceed with his 6th cycle of durvalumab immunotherapy this week.  Overall, he appears to be doing okay.  I will see him back in 4 weeks before he heads into his tth cycle of treatment.  The patient understands all the plans discussed today and is in agreement with them.    Dequincy Kirby Funk, MD

## 2023-06-02 ENCOUNTER — Encounter: Payer: Self-pay | Admitting: Oncology

## 2023-06-02 ENCOUNTER — Inpatient Hospital Stay: Payer: 59 | Attending: Oncology

## 2023-06-02 ENCOUNTER — Other Ambulatory Visit: Payer: Self-pay | Admitting: Oncology

## 2023-06-02 ENCOUNTER — Ambulatory Visit: Payer: 59 | Admitting: Oncology

## 2023-06-02 DIAGNOSIS — C3412 Malignant neoplasm of upper lobe, left bronchus or lung: Secondary | ICD-10-CM | POA: Insufficient documentation

## 2023-06-02 DIAGNOSIS — Z5112 Encounter for antineoplastic immunotherapy: Secondary | ICD-10-CM | POA: Insufficient documentation

## 2023-06-02 LAB — CBC AND DIFFERENTIAL
HCT: 41 (ref 41–53)
Hemoglobin: 13.5 (ref 13.5–17.5)
Neutrophils Absolute: 7.66
Platelets: 218 10*3/uL (ref 150–400)
WBC: 7.9

## 2023-06-02 LAB — CMP (CANCER CENTER ONLY)
ALT: 23 U/L (ref 0–44)
AST: 14 U/L — ABNORMAL LOW (ref 15–41)
Albumin: 2.7 g/dL — ABNORMAL LOW (ref 3.5–5.0)
Alkaline Phosphatase: 79 U/L (ref 38–126)
Anion gap: 11 (ref 5–15)
BUN: 20 mg/dL (ref 8–23)
CO2: 30 mmol/L (ref 22–32)
Calcium: 8.7 mg/dL — ABNORMAL LOW (ref 8.9–10.3)
Chloride: 94 mmol/L — ABNORMAL LOW (ref 98–111)
Creatinine: 0.75 mg/dL (ref 0.61–1.24)
GFR, Estimated: >60 mL/min (ref >60)
Glucose, Bld: 275 mg/dL — ABNORMAL HIGH (ref 70–99)
Potassium: 4.6 mmol/L (ref 3.5–5.1)
Sodium: 135 mmol/L (ref 135–145)
Total Bilirubin: 0.3 mg/dL (ref 0.3–1.2)
Total Protein: 6.3 g/dL — ABNORMAL LOW (ref 6.5–8.1)

## 2023-06-02 LAB — CBC: RBC: 4.31 (ref 3.87–5.11)

## 2023-06-02 MED ORDER — OXYCODONE HCL 5 MG PO TABS: 5.0000 mg | ORAL_TABLET | Freq: Four times a day (QID) | ORAL | 0 refills | Status: DC | PRN

## 2023-06-03 ENCOUNTER — Ambulatory Visit: Payer: 59

## 2023-06-03 ENCOUNTER — Other Ambulatory Visit: Payer: Self-pay

## 2023-06-03 VITALS — BP 118/70 | HR 98 | Temp 98.0°F | Resp 20 | Ht 75.0 in | Wt 155.0 lb

## 2023-06-03 DIAGNOSIS — E860 Dehydration: Secondary | ICD-10-CM

## 2023-06-03 DIAGNOSIS — Z5112 Encounter for antineoplastic immunotherapy: Secondary | ICD-10-CM | POA: Diagnosis not present

## 2023-06-03 DIAGNOSIS — C3412 Malignant neoplasm of upper lobe, left bronchus or lung: Secondary | ICD-10-CM

## 2023-06-03 MED ORDER — SODIUM CHLORIDE 0.9 % IV SOLN: Freq: Once | INTRAVENOUS | Status: AC

## 2023-06-03 MED ORDER — SODIUM CHLORIDE 0.9 % IV SOLN
1500.0000 mg | Freq: Once | INTRAVENOUS | Status: AC
  Administered 2023-06-03: 1500 mg via INTRAVENOUS
  Filled 2023-06-03: qty 30

## 2023-06-03 MED ORDER — SODIUM CHLORIDE 0.9% FLUSH: 10.0000 mL | INTRAVENOUS | Status: DC | PRN

## 2023-06-03 MED ORDER — HEPARIN SOD (PORK) LOCK FLUSH 100 UNIT/ML IV SOLN: 500.0000 [IU] | Freq: Once | INTRAVENOUS | Status: DC | PRN

## 2023-06-03 NOTE — Progress Notes (Signed)
PER KELLI, PT IS TO SEE PMP SOON TO DISCUSS LASIX AND HIS LOW BLOOD PRESSURES.  GIVEN 500 ML OF NS TODAY IV FOR LOW BLOOD PRESSURE.  SEE VITAL SIGNS.  PTS VERBALIZES UNDERSTANDING AND IS AGREEABLE

## 2023-06-03 NOTE — Patient Instructions (Signed)
PT INSTRUCTED TO SEE PRIMARY MEDICAL DOCTOR AS SOON AS POSSIBLE TO DISCUSS LASIX, AND LOW BLOOD PRESSURES.    Durvalumab Injection What is this medication? DURVALUMAB (dur VAL ue mab) treats some types of cancer. It works by helping your immune system slow or stop the spread of cancer cells. It is a monoclonal antibody. This medicine may be used for other purposes; ask your health care provider or pharmacist if you have questions. COMMON BRAND NAME(S): IMFINZI What should I tell my care team before I take this medication? They need to know if you have any of these conditions: Allogeneic stem cell transplant (uses someone else's stem cells) Autoimmune diseases, such as Crohn disease, ulcerative colitis, lupus History of chest radiation Nervous system problems, such as Guillain-Barre syndrome, myasthenia gravis Organ transplant An unusual or allergic reaction to durvalumab, other medications, foods, dyes, or preservatives Pregnant or trying to get pregnant Breast-feeding How should I use this medication? This medication is infused into a vein. It is given by your care team in a hospital or clinic setting. A special MedGuide will be given to you before each treatment. Be sure to read this information carefully each time. Talk to your care team about the use of this medication in children. Special care may be needed. Overdosage: If you think you have taken too much of this medicine contact a poison control center or emergency room at once. NOTE: This medicine is only for you. Do not share this medicine with others. What if I miss a dose? Keep appointments for follow-up doses. It is important not to miss your dose. Call your care team if you are unable to keep an appointment. What may interact with this medication? Interactions have not been studied. This list may not describe all possible interactions. Give your health care provider a list of all the medicines, herbs, non-prescription drugs,  or dietary supplements you use. Also tell them if you smoke, drink alcohol, or use illegal drugs. Some items may interact with your medicine. What should I watch for while using this medication? Your condition will be monitored carefully while you are receiving this medication. You may need blood work while taking this medication. This medication may cause serious skin reactions. They can happen weeks to months after starting the medication. Contact your care team right away if you notice fevers or flu-like symptoms with a rash. The rash may be red or purple and then turn into blisters or peeling of the skin. You may also notice a red rash with swelling of the face, lips, or lymph nodes in your neck or under your arms. Tell your care team right away if you have any change in your eyesight. Talk to your care team if you may be pregnant. Serious birth defects can occur if you take this medication during pregnancy and for 3 months after the last dose. You will need a negative pregnancy test before starting this medication. Contraception is recommended while taking this medication and for 3 months after the last dose. Your care team can help you find the option that works for you. Do not breastfeed while taking this medication and for 3 months after the last dose. What side effects may I notice from receiving this medication? Side effects that you should report to your care team as soon as possible: Allergic reactions--skin rash, itching, hives, swelling of the face, lips, tongue, or throat Dry cough, shortness of breath or trouble breathing Eye pain, redness, irritation, or discharge with blurry or  decreased vision Heart muscle inflammation--unusual weakness or fatigue, shortness of breath, chest pain, fast or irregular heartbeat, dizziness, swelling of the ankles, feet, or hands Hormone gland problems--headache, sensitivity to light, unusual weakness or fatigue, dizziness, fast or irregular heartbeat,  increased sensitivity to cold or heat, excessive sweating, constipation, hair loss, increased thirst or amount of urine, tremors or shaking, irritability Infusion reactions--chest pain, shortness of breath or trouble breathing, feeling faint or lightheaded Kidney injury (glomerulonephritis)--decrease in the amount of urine, red or dark brown urine, foamy or bubbly urine, swelling of the ankles, hands, or feet Liver injury--right upper belly pain, loss of appetite, nausea, light-colored stool, dark yellow or brown urine, yellowing skin or eyes, unusual weakness or fatigue Pain, tingling, or numbness in the hands or feet, muscle weakness, change in vision, confusion or trouble speaking, loss of balance or coordination, trouble walking, seizures Rash, fever, and swollen lymph nodes Redness, blistering, peeling, or loosening of the skin, including inside the mouth Sudden or severe stomach pain, bloody diarrhea, fever, nausea, vomiting Side effects that usually do not require medical attention (report these to your care team if they continue or are bothersome): Bone, joint, or muscle pain Diarrhea Fatigue Loss of appetite Nausea Skin rash This list may not describe all possible side effects. Call your doctor for medical advice about side effects. You may report side effects to FDA at 1-800-FDA-1088. Where should I keep my medication? This medication is given in a hospital or clinic. It will not be stored at home. NOTE: This sheet is a summary. It may not cover all possible information. If you have questions about this medicine, talk to your doctor, pharmacist, or health care provider.  2024 Elsevier/Gold Standard (2022-03-18 00:00:00)

## 2023-06-14 DIAGNOSIS — R7989 Other specified abnormal findings of blood chemistry: Secondary | ICD-10-CM | POA: Diagnosis not present

## 2023-06-14 DIAGNOSIS — R000 Tachycardia, unspecified: Secondary | ICD-10-CM | POA: Diagnosis not present

## 2023-06-14 DIAGNOSIS — I3139 Other pericardial effusion (noninflammatory): Secondary | ICD-10-CM | POA: Diagnosis not present

## 2023-06-14 DIAGNOSIS — I10 Essential (primary) hypertension: Secondary | ICD-10-CM | POA: Diagnosis not present

## 2023-06-14 DIAGNOSIS — I2489 Other forms of acute ischemic heart disease: Secondary | ICD-10-CM | POA: Diagnosis not present

## 2023-06-15 DIAGNOSIS — J9621 Acute and chronic respiratory failure with hypoxia: Secondary | ICD-10-CM | POA: Diagnosis not present

## 2023-06-15 DIAGNOSIS — R7989 Other specified abnormal findings of blood chemistry: Secondary | ICD-10-CM | POA: Diagnosis not present

## 2023-06-15 DIAGNOSIS — I21A1 Myocardial infarction type 2: Secondary | ICD-10-CM | POA: Diagnosis not present

## 2023-06-15 DIAGNOSIS — R000 Tachycardia, unspecified: Secondary | ICD-10-CM | POA: Diagnosis not present

## 2023-06-16 DIAGNOSIS — I21A1 Myocardial infarction type 2: Secondary | ICD-10-CM

## 2023-06-16 DIAGNOSIS — J9621 Acute and chronic respiratory failure with hypoxia: Secondary | ICD-10-CM

## 2023-06-16 DIAGNOSIS — R000 Tachycardia, unspecified: Secondary | ICD-10-CM

## 2023-06-16 DIAGNOSIS — R7989 Other specified abnormal findings of blood chemistry: Secondary | ICD-10-CM | POA: Diagnosis not present

## 2023-06-17 DIAGNOSIS — R000 Tachycardia, unspecified: Secondary | ICD-10-CM | POA: Diagnosis not present

## 2023-06-17 DIAGNOSIS — I21A1 Myocardial infarction type 2: Secondary | ICD-10-CM | POA: Diagnosis not present

## 2023-06-17 DIAGNOSIS — J9621 Acute and chronic respiratory failure with hypoxia: Secondary | ICD-10-CM | POA: Diagnosis not present

## 2023-06-17 DIAGNOSIS — R7989 Other specified abnormal findings of blood chemistry: Secondary | ICD-10-CM | POA: Diagnosis not present

## 2023-06-24 ENCOUNTER — Encounter: Payer: Self-pay | Admitting: Cardiology

## 2023-06-28 NOTE — Progress Notes (Unsigned)
[facility]  [address] [place],  [place]  [address] [phone]  Clinic Day:  06/29/2023  Referring physician: [facility]*  HISTORY OF PRESENT ILLNESS:  The patient is a 72 y.o. 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  He comes in today to be evaluated before heading into his 8th cycle of maintenance durvalumab immunotherapy. The patient comes into clinic today after recently being discharged from the hospital 10 days ago with pneumonia.  He was placed on IV antibiotics and was switched to oral antibiotics upon discharge.  Although he is doing better, he remains very weak from his hospitalization.  He has chronic shortness of breath for which he uses portable oxygen.  Due to his weakness, he wishes to delay his 8th cycle of pembrolizumab for one week.    PHYSICAL EXAM:  Blood pressure 128/61, pulse (!) 126, temperature 98.6 F (37 C), resp. rate 20, height 6\' 3"  (1.905 m), weight 144 lb 3.2 oz (65.4 kg), SpO2 92%. Wt Readings from Last 3 Encounters:  06/29/23 144 lb 3.2 oz (65.4 kg)  06/03/23 155 lb (70.3 kg)  06/02/23 153 lb 6.4 oz (69.6 kg)   Body mass index is 18.02 kg/m. Performance status (ECOG): 1 - Symptomatic but completely ambulatory Physical Exam Constitutional:      Appearance: Normal appearance. He is not ill-appearing.     Comments: A chronically ill-appearing older gentleman. He is wearing oxygen per nasal cannula.  He is clearly thinner, with his clothes too big for him  HENT:     Mouth/Throat:     Mouth: Mucous membranes are moist. No oral lesions.     Pharynx: Oropharynx is clear. No oropharyngeal exudate or posterior oropharyngeal erythema.  Cardiovascular:     Rate and Rhythm: Regular rhythm. Tachycardia present.     Heart sounds: No murmur heard.    No friction rub. No gallop.  Pulmonary:     Effort: Pulmonary effort is normal. No respiratory distress.     Breath sounds:  Decreased air movement present. Examination of the right-upper field reveals decreased breath sounds. Examination of the left-upper field reveals decreased breath sounds. Examination of the right-middle field reveals decreased breath sounds. Examination of the left-middle field reveals decreased breath sounds. Examination of the right-lower field reveals decreased breath sounds. Examination of the left-lower field reveals decreased breath sounds. Decreased breath sounds present. No wheezing, rhonchi or rales.  Abdominal:     General: Bowel sounds are normal. There is no distension.     Palpations: Abdomen is soft. There is no mass.     Tenderness: There is no abdominal tenderness.  Musculoskeletal:        General: No swelling.     Right lower leg: No edema.     Left lower leg: No edema.  Lymphadenopathy:     Cervical: No cervical adenopathy.     Upper Body:     Right upper body: No supraclavicular or axillary adenopathy.     Left upper body: No supraclavicular or axillary adenopathy.     Lower Body: No right inguinal adenopathy. No left inguinal adenopathy.  Skin:    General: Skin is warm.     Coloration: Skin is not jaundiced.     Findings: No lesion or rash.  Neurological:     General: No focal deficit present.     Mental Status: He is alert and oriented to person, place, and time. Mental status is  at baseline.  Psychiatric:        Mood and Affect: Mood normal.        Behavior: Behavior normal.        Thought Content: Thought content normal.    LABS:       Latest Ref Rng & Units 06/02/2023   12:00 AM 05/04/2023   12:00 AM 04/07/2023   12:00 AM  CBC  WBC  7.9     7.8     8.2      Hemoglobin 13.5 - 17.5 13.5     14.1     14.3      Hematocrit 41 - 53 41     42     43      Platelets 150 - 400 K/uL 218     182     185         This result is from an external source.      Latest Ref Rng & Units 06/29/2023   10:47 AM 06/02/2023    8:33 AM 05/04/2023    8:59 AM  CMP  Glucose 70 -  99 mg/dL 865  784  696   BUN 8 - 23 mg/dL 27  20  19    Creatinine 0.61 - 1.24 mg/dL 2.95  2.84  1.32   Sodium 135 - 145 mmol/L 128  135  136   Potassium 3.5 - 5.1 mmol/L 4.9  4.6  4.7   Chloride 98 - 111 mmol/L 88  94  97   CO2 22 - 32 mmol/L 28  30  25    Calcium 8.9 - 10.3 mg/dL 8.8  8.7  9.1   Total Protein 6.5 - 8.1 g/dL 6.3  6.3  7.2   Total Bilirubin 0.3 - 1.2 mg/dL 0.6  0.3  0.8   Alkaline Phos 38 - 126 U/L 112  79  76   AST 15 - 41 U/L 18  14  17    ALT 0 - 44 U/L 29  23  19      ASSESSMENT & PLAN:  A 72 y.o. 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  As mentioned previously, he is still recuperating from his recent hospitalization for pneumonia.  I do believe he needs another week to physically recuperate before he heads into his 8th cycle of durvalumab immunotherapy, for which he agrees.  His 8th cycle of durvalumab will now be given on Wednesday, August 21st.  I will see him back 4 weeks later before he heads into his 9th cycle of treatment.  CT scans will be done a day before his next visit to evaluate his new disease baseline after 8 cycles of durvalumab.   The patient understands all the plans discussed today and is in agreement with them.     Kirby Funk, MD

## 2023-06-29 ENCOUNTER — Encounter: Payer: Self-pay | Admitting: Oncology

## 2023-06-29 ENCOUNTER — Inpatient Hospital Stay: Payer: 59

## 2023-06-29 ENCOUNTER — Other Ambulatory Visit: Payer: Self-pay | Admitting: Oncology

## 2023-06-29 ENCOUNTER — Inpatient Hospital Stay: Payer: 59 | Attending: Oncology | Admitting: Oncology

## 2023-06-29 DIAGNOSIS — C3412 Malignant neoplasm of upper lobe, left bronchus or lung: Secondary | ICD-10-CM

## 2023-06-29 DIAGNOSIS — Z5112 Encounter for antineoplastic immunotherapy: Secondary | ICD-10-CM | POA: Insufficient documentation

## 2023-06-29 LAB — CBC AND DIFFERENTIAL
HCT: 39 — AB (ref 41–53)
Hemoglobin: 12.9 — AB (ref 13.5–17.5)
Neutrophils Absolute: 11.12
Platelets: 147 10*3/uL — AB (ref 150–400)
WBC: 11.7

## 2023-06-29 LAB — CMP (CANCER CENTER ONLY)
ALT: 29 U/L (ref 0–44)
AST: 18 U/L (ref 15–41)
Albumin: 2.8 g/dL — ABNORMAL LOW (ref 3.5–5.0)
Alkaline Phosphatase: 112 U/L (ref 38–126)
Anion gap: 12 (ref 5–15)
BUN: 27 mg/dL — ABNORMAL HIGH (ref 8–23)
CO2: 28 mmol/L (ref 22–32)
Calcium: 8.8 mg/dL — ABNORMAL LOW (ref 8.9–10.3)
Chloride: 88 mmol/L — ABNORMAL LOW (ref 98–111)
Creatinine: 0.65 mg/dL (ref 0.61–1.24)
GFR, Estimated: >60 mL/min (ref >60)
Glucose, Bld: 406 mg/dL — ABNORMAL HIGH (ref 70–99)
Potassium: 4.9 mmol/L (ref 3.5–5.1)
Sodium: 128 mmol/L — ABNORMAL LOW (ref 135–145)
Total Bilirubin: 0.6 mg/dL (ref 0.3–1.2)
Total Protein: 6.3 g/dL — ABNORMAL LOW (ref 6.5–8.1)

## 2023-06-29 LAB — CBC: RBC: 4.27 (ref 3.87–5.11)

## 2023-06-29 LAB — CBC W DIFFERENTIAL (~~LOC~~ CC SCANNED REPORT)

## 2023-06-30 ENCOUNTER — Telehealth: Payer: Self-pay | Admitting: Oncology

## 2023-06-30 NOTE — Telephone Encounter (Signed)
Unable to reach via phone, voicemail was left.   Scheduling Message Entered by Domenic Schwab on 06/29/2023 at  4:47 PM Priority: Routine <No visit type provided>  Department: [facility]  Provider:  Appointment Notes:  Please delay next treatment from 8/14 to 8/21.  He will need labs 2 days prior.  See LOS for f/u info.

## 2023-07-01 ENCOUNTER — Ambulatory Visit: Payer: 59

## 2023-07-02 ENCOUNTER — Other Ambulatory Visit: Payer: Self-pay

## 2023-07-06 ENCOUNTER — Inpatient Hospital Stay: Payer: 59

## 2023-07-07 ENCOUNTER — Inpatient Hospital Stay: Payer: 59

## 2023-07-07 DIAGNOSIS — C3412 Malignant neoplasm of upper lobe, left bronchus or lung: Secondary | ICD-10-CM

## 2023-07-07 LAB — CBC AND DIFFERENTIAL
HCT: 41 (ref 41–53)
Hemoglobin: 13.6 (ref 13.5–17.5)
Neutrophils Absolute: 8.15
Platelets: 138 10*3/uL — AB (ref 150–400)
WBC: 8.4

## 2023-07-07 LAB — BASIC METABOLIC PANEL
BUN: 19 (ref 4–21)
CO2: 39 — AB (ref 13–22)
Chloride: 90 — AB (ref 99–108)
Creatinine: 0.5 — AB (ref 0.6–1.3)
Glucose: 263
Potassium: 4.7 mEq/L (ref 3.5–5.1)
Sodium: 131 — AB (ref 137–147)

## 2023-07-07 LAB — COMPREHENSIVE METABOLIC PANEL
Albumin: 3.4 — AB (ref 3.5–5.0)
Calcium: 9.4 (ref 8.7–10.7)

## 2023-07-07 LAB — CBC: RBC: 4.44 (ref 3.87–5.11)

## 2023-07-07 LAB — HEPATIC FUNCTION PANEL
ALT: 46 U/L — AB (ref 10–40)
AST: 46 — AB (ref 14–40)
Alkaline Phosphatase: 223 — AB (ref 25–125)
Bilirubin, Total: 0.7

## 2023-07-07 MED FILL — Durvalumab Soln for IV Infusion 500 MG/10ML (50 MG/ML): INTRAVENOUS | Qty: 30 | Status: AC

## 2023-07-08 ENCOUNTER — Inpatient Hospital Stay: Payer: 59

## 2023-07-08 ENCOUNTER — Other Ambulatory Visit: Payer: Self-pay

## 2023-07-08 ENCOUNTER — Telehealth: Payer: Self-pay | Admitting: Oncology

## 2023-07-08 VITALS — BP 112/59 | HR 115 | Temp 98.0°F | Resp 20 | Ht 75.0 in | Wt 139.0 lb

## 2023-07-08 DIAGNOSIS — C3412 Malignant neoplasm of upper lobe, left bronchus or lung: Secondary | ICD-10-CM

## 2023-07-08 DIAGNOSIS — Z5112 Encounter for antineoplastic immunotherapy: Secondary | ICD-10-CM | POA: Diagnosis not present

## 2023-07-08 MED ORDER — SODIUM CHLORIDE 0.9 % IV SOLN
1500.0000 mg | Freq: Once | INTRAVENOUS | Status: AC
  Administered 2023-07-08: 1500 mg via INTRAVENOUS
  Filled 2023-07-08: qty 30

## 2023-07-08 MED ORDER — SODIUM CHLORIDE 0.9% FLUSH: 10.0000 mL | INTRAVENOUS | Status: DC | PRN

## 2023-07-08 MED ORDER — SODIUM CHLORIDE 0.9 % IV SOLN: Freq: Once | INTRAVENOUS | Status: AC

## 2023-07-08 MED ORDER — HEPARIN SOD (PORK) LOCK FLUSH 100 UNIT/ML IV SOLN: 500.0000 [IU] | Freq: Once | INTRAVENOUS | Status: DC | PRN

## 2023-07-08 NOTE — Telephone Encounter (Signed)
07/08/2023  Patient is on his way

## 2023-07-08 NOTE — Patient Instructions (Signed)
Durvalumab Injection What is this medication? DURVALUMAB (dur VAL ue mab) treats some types of cancer. It works by helping your immune system slow or stop the spread of cancer cells. It is a monoclonal antibody. This medicine may be used for other purposes; ask your health care provider or pharmacist if you have questions. COMMON BRAND NAME(S): IMFINZI What should I tell my care team before I take this medication? They need to know if you have any of these conditions: Allogeneic stem cell transplant (uses someone else's stem cells) Autoimmune diseases, such as Crohn disease, ulcerative colitis, lupus History of chest radiation Nervous system problems, such as Guillain-Barre syndrome, myasthenia gravis Organ transplant An unusual or allergic reaction to durvalumab, other medications, foods, dyes, or preservatives Pregnant or trying to get pregnant Breast-feeding How should I use this medication? This medication is infused into a vein. It is given by your care team in a hospital or clinic setting. A special MedGuide will be given to you before each treatment. Be sure to read this information carefully each time. Talk to your care team about the use of this medication in children. Special care may be needed. Overdosage: If you think you have taken too much of this medicine contact a poison control center or emergency room at once. NOTE: This medicine is only for you. Do not share this medicine with others. What if I miss a dose? Keep appointments for follow-up doses. It is important not to miss your dose. Call your care team if you are unable to keep an appointment. What may interact with this medication? Interactions have not been studied. This list may not describe all possible interactions. Give your health care provider a list of all the medicines, herbs, non-prescription drugs, or dietary supplements you use. Also tell them if you smoke, drink alcohol, or use illegal drugs. Some items may  interact with your medicine. What should I watch for while using this medication? Your condition will be monitored carefully while you are receiving this medication. You may need blood work while taking this medication. This medication may cause serious skin reactions. They can happen weeks to months after starting the medication. Contact your care team right away if you notice fevers or flu-like symptoms with a rash. The rash may be red or purple and then turn into blisters or peeling of the skin. You may also notice a red rash with swelling of the face, lips, or lymph nodes in your neck or under your arms. Tell your care team right away if you have any change in your eyesight. Talk to your care team if you may be pregnant. Serious birth defects can occur if you take this medication during pregnancy and for 3 months after the last dose. You will need a negative pregnancy test before starting this medication. Contraception is recommended while taking this medication and for 3 months after the last dose. Your care team can help you find the option that works for you. Do not breastfeed while taking this medication and for 3 months after the last dose. What side effects may I notice from receiving this medication? Side effects that you should report to your care team as soon as possible: Allergic reactions--skin rash, itching, hives, swelling of the face, lips, tongue, or throat Dry cough, shortness of breath or trouble breathing Eye pain, redness, irritation, or discharge with blurry or decreased vision Heart muscle inflammation--unusual weakness or fatigue, shortness of breath, chest pain, fast or irregular heartbeat, dizziness, swelling of the   ankles, feet, or hands Hormone gland problems--headache, sensitivity to light, unusual weakness or fatigue, dizziness, fast or irregular heartbeat, increased sensitivity to cold or heat, excessive sweating, constipation, hair loss, increased thirst or amount of  urine, tremors or shaking, irritability Infusion reactions--chest pain, shortness of breath or trouble breathing, feeling faint or lightheaded Kidney injury (glomerulonephritis)--decrease in the amount of urine, red or dark brown urine, foamy or bubbly urine, swelling of the ankles, hands, or feet Liver injury--right upper belly pain, loss of appetite, nausea, light-colored stool, dark yellow or brown urine, yellowing skin or eyes, unusual weakness or fatigue Pain, tingling, or numbness in the hands or feet, muscle weakness, change in vision, confusion or trouble speaking, loss of balance or coordination, trouble walking, seizures Rash, fever, and swollen lymph nodes Redness, blistering, peeling, or loosening of the skin, including inside the mouth Sudden or severe stomach pain, bloody diarrhea, fever, nausea, vomiting Side effects that usually do not require medical attention (report these to your care team if they continue or are bothersome): Bone, joint, or muscle pain Diarrhea Fatigue Loss of appetite Nausea Skin rash This list may not describe all possible side effects. Call your doctor for medical advice about side effects. You may report side effects to FDA at 1-800-FDA-1088. Where should I keep my medication? This medication is given in a hospital or clinic. It will not be stored at home. NOTE: This sheet is a summary. It may not cover all possible information. If you have questions about this medicine, talk to your doctor, pharmacist, or health care provider.  2024 Elsevier/Gold Standard (2022-03-18 00:00:00)  

## 2023-07-13 ENCOUNTER — Telehealth: Payer: Self-pay

## 2023-07-13 NOTE — Telephone Encounter (Signed)
Pt in ER w/ SOB via EMS (was 75% sat on room air when EMS arrived to pt's home), Resp 26, tachy 130s , CTA results pending.  Admitting w/ PNA, increase in mass RUL now 6.8cm (was approx 5cm) . Above information sent to Dr Melvyn Neth, [facility], Tammy B,CMA for Dr Melvyn Neth, and Eugenia Mcalpine, infusion charge nurse.

## 2023-07-19 DEATH — deceased

## 2023-07-27 ENCOUNTER — Ambulatory Visit: Payer: 59 | Admitting: Oncology

## 2023-07-27 ENCOUNTER — Other Ambulatory Visit: Payer: 59

## 2023-07-29 ENCOUNTER — Ambulatory Visit: Payer: 59

## 2023-08-04 ENCOUNTER — Ambulatory Visit: Payer: 59 | Admitting: Oncology

## 2023-08-05 ENCOUNTER — Ambulatory Visit: Payer: 59

## 2023-08-31 ENCOUNTER — Ambulatory Visit: Payer: 59 | Admitting: Oncology

## 2023-08-31 ENCOUNTER — Other Ambulatory Visit: Payer: 59

## 2023-09-02 ENCOUNTER — Ambulatory Visit: Payer: 59
